# Patient Record
Sex: Female | Born: 1940 | Race: White | Hispanic: No | Smoking: Never smoker
Health system: Southern US, Community
[De-identification: ages and names within clinical notes are randomized; demographics above are authoritative.]

## PROBLEM LIST (undated history)

## (undated) DIAGNOSIS — F32A Depression, unspecified: Secondary | ICD-10-CM

## (undated) DIAGNOSIS — C55 Malignant neoplasm of uterus, part unspecified: Secondary | ICD-10-CM

## (undated) DIAGNOSIS — E785 Hyperlipidemia, unspecified: Secondary | ICD-10-CM

## (undated) DIAGNOSIS — E1142 Type 2 diabetes mellitus with diabetic polyneuropathy: Secondary | ICD-10-CM

## (undated) DIAGNOSIS — I10 Essential (primary) hypertension: Secondary | ICD-10-CM

## (undated) DIAGNOSIS — I639 Cerebral infarction, unspecified: Secondary | ICD-10-CM

## (undated) DIAGNOSIS — E119 Type 2 diabetes mellitus without complications: Secondary | ICD-10-CM

## (undated) DIAGNOSIS — F329 Major depressive disorder, single episode, unspecified: Secondary | ICD-10-CM

## (undated) DIAGNOSIS — K219 Gastro-esophageal reflux disease without esophagitis: Secondary | ICD-10-CM

## (undated) DIAGNOSIS — E669 Obesity, unspecified: Secondary | ICD-10-CM

## (undated) HISTORY — PX: ABDOMINAL HYSTERECTOMY: SHX81

## (undated) HISTORY — PX: UPPER GI ENDOSCOPY: SHX6162

## (undated) HISTORY — PX: COLONOSCOPY: SHX174

---

## 2007-03-30 ENCOUNTER — Ambulatory Visit: Payer: Self-pay | Admitting: Obstetrics and Gynecology

## 2007-09-21 ENCOUNTER — Ambulatory Visit: Payer: Self-pay | Admitting: Gastroenterology

## 2008-03-31 ENCOUNTER — Ambulatory Visit: Payer: Self-pay | Admitting: Obstetrics and Gynecology

## 2009-05-04 ENCOUNTER — Ambulatory Visit: Payer: Self-pay | Admitting: Obstetrics and Gynecology

## 2010-05-10 ENCOUNTER — Ambulatory Visit: Payer: Self-pay | Admitting: Obstetrics and Gynecology

## 2015-06-22 ENCOUNTER — Other Ambulatory Visit: Payer: Self-pay | Admitting: Internal Medicine

## 2015-06-22 DIAGNOSIS — Z1231 Encounter for screening mammogram for malignant neoplasm of breast: Secondary | ICD-10-CM

## 2015-06-28 ENCOUNTER — Ambulatory Visit
Admission: RE | Admit: 2015-06-28 | Discharge: 2015-06-28 | Disposition: A | Discharge status: Home / Self Care | Payer: Medicare Other | Source: Ambulatory Visit | Attending: Internal Medicine | Admitting: Internal Medicine

## 2015-06-28 DIAGNOSIS — Z1231 Encounter for screening mammogram for malignant neoplasm of breast: Secondary | ICD-10-CM | POA: Insufficient documentation

## 2015-06-28 DIAGNOSIS — R928 Other abnormal and inconclusive findings on diagnostic imaging of breast: Secondary | ICD-10-CM | POA: Diagnosis not present

## 2015-07-03 ENCOUNTER — Other Ambulatory Visit: Payer: Self-pay | Admitting: Internal Medicine

## 2015-07-03 DIAGNOSIS — R928 Other abnormal and inconclusive findings on diagnostic imaging of breast: Secondary | ICD-10-CM

## 2015-08-22 ENCOUNTER — Ambulatory Visit: Payer: Medicare Other

## 2015-08-22 ENCOUNTER — Other Ambulatory Visit: Payer: Medicare Other

## 2015-09-01 ENCOUNTER — Encounter: Payer: Self-pay | Admitting: *Deleted

## 2015-09-04 ENCOUNTER — Encounter
Admission: RE | Disposition: A | Discharge status: Home / Self Care | Payer: Self-pay | Source: Ambulatory Visit | Attending: Unknown Physician Specialty

## 2015-09-04 ENCOUNTER — Ambulatory Visit
Admission: RE | Admit: 2015-09-04 | Discharge: 2015-09-04 | Disposition: A | Discharge status: Home / Self Care | Payer: Medicare Other | Source: Ambulatory Visit | Attending: Unknown Physician Specialty | Admitting: Unknown Physician Specialty

## 2015-09-04 ENCOUNTER — Ambulatory Visit: Payer: Medicare Other | Admitting: Certified Registered Nurse Anesthetist

## 2015-09-04 ENCOUNTER — Encounter: Payer: Self-pay | Admitting: *Deleted

## 2015-09-04 DIAGNOSIS — Z7982 Long term (current) use of aspirin: Secondary | ICD-10-CM | POA: Diagnosis not present

## 2015-09-04 DIAGNOSIS — E669 Obesity, unspecified: Secondary | ICD-10-CM | POA: Diagnosis not present

## 2015-09-04 DIAGNOSIS — Z803 Family history of malignant neoplasm of breast: Secondary | ICD-10-CM | POA: Insufficient documentation

## 2015-09-04 DIAGNOSIS — Z794 Long term (current) use of insulin: Secondary | ICD-10-CM | POA: Insufficient documentation

## 2015-09-04 DIAGNOSIS — E1142 Type 2 diabetes mellitus with diabetic polyneuropathy: Secondary | ICD-10-CM | POA: Diagnosis not present

## 2015-09-04 DIAGNOSIS — K210 Gastro-esophageal reflux disease with esophagitis: Secondary | ICD-10-CM | POA: Diagnosis not present

## 2015-09-04 DIAGNOSIS — K640 First degree hemorrhoids: Secondary | ICD-10-CM | POA: Diagnosis not present

## 2015-09-04 DIAGNOSIS — Z6832 Body mass index (BMI) 32.0-32.9, adult: Secondary | ICD-10-CM | POA: Insufficient documentation

## 2015-09-04 DIAGNOSIS — K3189 Other diseases of stomach and duodenum: Secondary | ICD-10-CM | POA: Diagnosis not present

## 2015-09-04 DIAGNOSIS — D123 Benign neoplasm of transverse colon: Secondary | ICD-10-CM | POA: Diagnosis not present

## 2015-09-04 DIAGNOSIS — Z8542 Personal history of malignant neoplasm of other parts of uterus: Secondary | ICD-10-CM | POA: Diagnosis not present

## 2015-09-04 DIAGNOSIS — Z86010 Personal history of colonic polyps: Secondary | ICD-10-CM | POA: Diagnosis present

## 2015-09-04 DIAGNOSIS — F329 Major depressive disorder, single episode, unspecified: Secondary | ICD-10-CM | POA: Diagnosis not present

## 2015-09-04 DIAGNOSIS — K2970 Gastritis, unspecified, without bleeding: Secondary | ICD-10-CM | POA: Insufficient documentation

## 2015-09-04 DIAGNOSIS — I10 Essential (primary) hypertension: Secondary | ICD-10-CM | POA: Diagnosis not present

## 2015-09-04 DIAGNOSIS — Z79899 Other long term (current) drug therapy: Secondary | ICD-10-CM | POA: Insufficient documentation

## 2015-09-04 DIAGNOSIS — K5730 Diverticulosis of large intestine without perforation or abscess without bleeding: Secondary | ICD-10-CM | POA: Diagnosis not present

## 2015-09-04 HISTORY — DX: Hyperlipidemia, unspecified: E78.5

## 2015-09-04 HISTORY — DX: Major depressive disorder, single episode, unspecified: F32.9

## 2015-09-04 HISTORY — DX: Depression, unspecified: F32.A

## 2015-09-04 HISTORY — DX: Malignant neoplasm of uterus, part unspecified: C55

## 2015-09-04 HISTORY — DX: Essential (primary) hypertension: I10

## 2015-09-04 HISTORY — DX: Obesity, unspecified: E66.9

## 2015-09-04 HISTORY — DX: Type 2 diabetes mellitus without complications: E11.9

## 2015-09-04 HISTORY — DX: Type 2 diabetes mellitus with diabetic polyneuropathy: E11.42

## 2015-09-04 HISTORY — DX: Gastro-esophageal reflux disease without esophagitis: K21.9

## 2015-09-04 HISTORY — PX: ESOPHAGOGASTRODUODENOSCOPY (EGD) WITH PROPOFOL: SHX5813

## 2015-09-04 HISTORY — PX: COLONOSCOPY WITH PROPOFOL: SHX5780

## 2015-09-04 LAB — GLUCOSE, CAPILLARY
GLUCOSE-CAPILLARY: 342 mg/dL — AB (ref 65–99)
GLUCOSE-CAPILLARY: 320 mg/dL — AB (ref 65–99)

## 2015-09-04 SURGERY — COLONOSCOPY WITH PROPOFOL
Anesthesia: General

## 2015-09-04 MED ORDER — INSULIN REGULAR HUMAN 100 UNIT/ML IJ SOLN
5.0000 [IU] | Freq: Once | INTRAMUSCULAR | Status: AC
  Administered 2015-09-04: 5 [IU] via INTRAVENOUS
  Filled 2015-09-04: qty 0.05

## 2015-09-04 MED ORDER — LIDOCAINE HCL (PF) 2 % IJ SOLN
INTRAMUSCULAR | Status: DC | PRN
  Administered 2015-09-04: 50 mg

## 2015-09-04 MED ORDER — FENTANYL CITRATE (PF) 100 MCG/2ML IJ SOLN
INTRAMUSCULAR | Status: DC | PRN
  Administered 2015-09-04: 50 ug via INTRAVENOUS

## 2015-09-04 MED ORDER — ONDANSETRON HCL 4 MG/2ML IJ SOLN
INTRAMUSCULAR | Status: AC
  Administered 2015-09-04: 4 mg via INTRAVENOUS
  Filled 2015-09-04: qty 2

## 2015-09-04 MED ORDER — SODIUM CHLORIDE 0.9 % IV SOLN
INTRAVENOUS | Status: DC
  Administered 2015-09-04: 1000 mL via INTRAVENOUS

## 2015-09-04 MED ORDER — PROPOFOL 500 MG/50ML IV EMUL
INTRAVENOUS | Status: DC | PRN
  Administered 2015-09-04: 100 ug/kg/min via INTRAVENOUS

## 2015-09-04 MED ORDER — SODIUM CHLORIDE 0.9 % IV SOLN: INTRAVENOUS | Status: DC

## 2015-09-04 MED ORDER — MIDAZOLAM HCL 5 MG/5ML IJ SOLN
INTRAMUSCULAR | Status: DC | PRN
  Administered 2015-09-04: 1 mg via INTRAVENOUS

## 2015-09-04 MED ORDER — PROPOFOL 10 MG/ML IV BOLUS
INTRAVENOUS | Status: DC | PRN
  Administered 2015-09-04 (×2): 10 mg via INTRAVENOUS
  Administered 2015-09-04: 30 mg via INTRAVENOUS

## 2015-09-04 MED ORDER — INSULIN REGULAR HUMAN 100 UNIT/ML IJ SOLN
5.0000 [IU] | Freq: Once | INTRAMUSCULAR | Status: AC
  Administered 2015-09-04: 5 [IU] via INTRAVENOUS

## 2015-09-04 NOTE — Transfer of Care (Signed)
Immediate Anesthesia Transfer of Care Note  Patient: Kimberly Duarte  Procedure(s) Performed: Procedure(s): COLONOSCOPY WITH PROPOFOL (N/A) ESOPHAGOGASTRODUODENOSCOPY (EGD) WITH PROPOFOL (N/A)  Patient Location: PACU  Anesthesia Type:General  Level of Consciousness: sedated  Airway & Oxygen Therapy: Patient Spontanous Breathing and Patient connected to nasal cannula oxygen  Post-op Assessment: Report given to RN and Post -op Vital signs reviewed and stable  Post vital signs: Reviewed and stable  Last Vitals:  Filed Vitals:   09/04/15 0727  BP: 171/92  Pulse: 98  Temp: 37.1 C  Resp: 18    Complications: No apparent anesthesia complications

## 2015-09-04 NOTE — Anesthesia Preprocedure Evaluation (Signed)
Anesthesia Evaluation  Patient identified by MRN, date of birth, ID band Patient awake    Reviewed: Allergy & Precautions, H&P , NPO status , Patient's Chart, lab work & pertinent test results, reviewed documented beta blocker date and time   History of Anesthesia Complications (+) PONV and history of anesthetic complications  Airway Mallampati: II  TM Distance: >3 FB Neck ROM: full    Dental no notable dental hx. (+) Edentulous Upper, Edentulous Lower, Upper Dentures, Lower Dentures   Pulmonary neg pulmonary ROS,    Pulmonary exam normal breath sounds clear to auscultation       Cardiovascular Exercise Tolerance: Good hypertension, On Medications (-) angina(-) CAD, (-) Past MI, (-) Cardiac Stents and (-) CABG Normal cardiovascular exam(-) dysrhythmias (-) Valvular Problems/Murmurs Rhythm:regular Rate:Normal     Neuro/Psych neg Seizures PSYCHIATRIC DISORDERS (depression)  Neuromuscular disease (peripheral neuropathy)    GI/Hepatic Neg liver ROS, GERD  Medicated and Controlled,  Endo/Other  diabetes, Poorly Controlled, Insulin Dependent  Renal/GU negative Renal ROS  negative genitourinary   Musculoskeletal   Abdominal   Peds  Hematology negative hematology ROS (+)   Anesthesia Other Findings Past Medical History:   Depression                                                   Diabetes mellitus without complication (HCC)                 Diabetic peripheral neuropathy (HCC)                         GERD (gastroesophageal reflux disease)                       Hypertension                                                 Hyperlipidemia                                               Obesity                                                      Uterine cancer (HCC)                                         Reproductive/Obstetrics negative OB ROS                             Anesthesia  Physical Anesthesia Plan  ASA: III  Anesthesia Plan: General   Post-op Pain Management:    Induction:   Airway Management Planned:   Additional Equipment:   Intra-op Plan:   Post-operative Plan:   Informed Consent: I have reviewed the patients  History and Physical, chart, labs and discussed the procedure including the risks, benefits and alternatives for the proposed anesthesia with the patient or authorized representative who has indicated his/her understanding and acceptance.   Dental Advisory Given  Plan Discussed with: Anesthesiologist, CRNA and Surgeon  Anesthesia Plan Comments:         Anesthesia Quick Evaluation

## 2015-09-04 NOTE — Op Note (Signed)
[facility] Gastroenterology Patient Name: Kimberly Duarte Procedure Date: 09/04/2015 8:07 AM MRN: [id number] Account #: [id number] Date of Birth: 05-Jun-1941 Admit Type: Outpatient Age: 74 Room: [facility] ENDO ROOM 1 Gender: Female Note Status: Finalized Procedure:         Colonoscopy Indications:       High risk colon cancer surveillance: Personal history of                     colonic polyps Providers:         Manya Silvas, MD Medicines:         Propofol per Anesthesia Complications:     No immediate complications. Procedure:         Pre-Anesthesia Assessment:                    - After reviewing the risks and benefits, the patient was                     deemed in satisfactory condition to undergo the procedure.                    After obtaining informed consent, the colonoscope was                     passed under direct vision. Throughout the procedure, the                     patient's blood pressure, pulse, and oxygen saturations                     were monitored continuously. The Colonoscope was                     introduced through the anus and advanced to the the cecum,                     identified by appendiceal orifice and ileocecal valve. The                     colonoscopy was performed without difficulty. The patient                     tolerated the procedure well. The quality of the bowel                     preparation was good. Findings:      A diminutive polyp was found at the splenic flexure. The polyp was       sessile. The polyp was removed with a jumbo cold forceps. Resection and       retrieval were complete.      Multiple small-medium-mouthed diverticula were found in the sigmoid       colon, in the descending colon and in the transverse colon.      Internal hemorrhoids were found during endoscopy. The hemorrhoids were       small and Grade I (internal hemorrhoids that do not prolapse). Impression:        - One diminutive polyp at  the splenic flexure. Resected                     and retrieved.                    - Diverticulosis in the sigmoid colon, in  the descending                     colon and in the transverse colon.                    - Internal hemorrhoids. Recommendation:    - Await pathology results. Manya Silvas, MD 09/04/2015 8:46:21 AM This report has been signed electronically. Number of Addenda: 0 Note Initiated On: 09/04/2015 8:07 AM Scope Withdrawal Time: 0 hours 8 minutes 58 seconds  Total Procedure Duration: 0 hours 12 minutes 20 seconds       [facility]

## 2015-09-04 NOTE — Anesthesia Postprocedure Evaluation (Signed)
Anesthesia Post Note  Patient: KATURAH SULEK  Procedure(s) Performed: Procedure(s) (LRB): COLONOSCOPY WITH PROPOFOL (N/A) ESOPHAGOGASTRODUODENOSCOPY (EGD) WITH PROPOFOL (N/A)  Patient location during evaluation: PACU Anesthesia Type: General Level of consciousness: awake and alert Pain management: pain level controlled Vital Signs Assessment: post-procedure vital signs reviewed and stable Respiratory status: spontaneous breathing, nonlabored ventilation, respiratory function stable and patient connected to nasal cannula oxygen Cardiovascular status: blood pressure returned to baseline and stable Postop Assessment: no signs of nausea or vomiting Anesthetic complications: no    Last Vitals:  Filed Vitals:   09/04/15 0910 09/04/15 0920  BP: 143/64 149/68  Pulse: 100 92  Temp:    Resp: 18 21    Last Pain: There were no vitals filed for this visit.               Martha Clan

## 2015-09-04 NOTE — H&P (Signed)
Primary Care Physician:  Tracie Harrier, MD Primary Gastroenterologist:  Dr. Vira Agar  Pre-Procedure History & Physical: HPI:  Kimberly Duarte is a 74 y.o. female is here for an endoscopy and colonoscopy.She denies anything by mouth since midnight.   Past Medical History  Diagnosis Date  . Depression   . Diabetes mellitus without complication ([place])   . Diabetic peripheral neuropathy ([place])   . GERD (gastroesophageal reflux disease)   . Hypertension   . Hyperlipidemia   . Obesity   . Uterine cancer [facility])     Past Surgical History  Procedure Laterality Date  . Abdominal hysterectomy    . Colonoscopy    . Upper gi endoscopy      Prior to Admission medications   Medication Sig Start Date End Date Taking? Authorizing Provider  aspirin 81 MG tablet Take 81 mg by mouth daily.   Yes Historical Provider, MD  citalopram (CELEXA) 20 MG tablet Take 20 mg by mouth daily.   Yes Historical Provider, MD  glipiZIDE (GLUCOTROL) 10 MG tablet Take 10 mg by mouth daily before breakfast.   Yes Historical Provider, MD  hydrochlorothiazide (HYDRODIURIL) 25 MG tablet Take 25 mg by mouth daily.   Yes Historical Provider, MD  insulin aspart (NOVOLOG) 100 UNIT/ML injection Inject into the skin 3 (three) times daily before meals.   Yes Historical Provider, MD  lisinopril (PRINIVIL,ZESTRIL) 10 MG tablet Take 10 mg by mouth daily.   Yes Historical Provider, MD  metFORMIN (GLUCOPHAGE) 1000 MG tablet Take 1,000 mg by mouth 2 (two) times daily with a meal.   Yes Historical Provider, MD  omeprazole (PRILOSEC) 20 MG capsule Take 20 mg by mouth daily.   Yes Historical Provider, MD  pregabalin (LYRICA) 150 MG capsule Take 150 mg by mouth 2 (two) times daily.   Yes Historical Provider, MD  rOPINIRole (REQUIP) 1 MG tablet Take 1 mg by mouth 3 (three) times daily.   Yes Historical Provider, MD  Vitamin D, Ergocalciferol, (DRISDOL) 50000 UNITS CAPS capsule Take 50,000 Units by mouth every 7 (seven) days.   Yes  Historical Provider, MD    Allergies as of 08/01/2015  . (Not on File)    Family History  Problem Relation Age of Onset  . Breast cancer Sister 70    Social History   Social History  . Marital Status: Widowed    Spouse Name: N/A  . Number of Children: N/A  . Years of Education: N/A   Occupational History  . Not on file.   Social History Main Topics  . Smoking status: Never Smoker   . Smokeless tobacco: Not on file  . Alcohol Use: Not on file  . Drug Use: Not on file  . Sexual Activity: Not on file   Other Topics Concern  . Not on file   Social History Narrative    Review of Systems: See HPI, otherwise negative ROS  Physical Exam: BP 171/92 mmHg  Pulse 98  Temp(Src) 98.7 F (37.1 C) (Tympanic)  Resp 18  Ht 5\' 4"  (1.626 m)  Wt 86.183 kg (190 lb)  BMI 32.60 kg/m2  SpO2 96% General:   Alert,  pleasant and cooperative in NAD Head:  Normocephalic and atraumatic. Neck:  Supple; no masses or thyromegaly. Lungs:  Clear throughout to auscultation.    Heart:  Regular rate and rhythm. Abdomen:  Soft, nontender and nondistended. Normal bowel sounds, without guarding, and without rebound.   Neurologic:  Alert and  oriented x4;  grossly normal neurologically.  Impression/Plan: Kimberly Duarte is here for an endoscopy and colonoscopy to be performed for [facility] colon polyps and GERD  Risks, benefits, limitations, and alternatives regarding  endoscopy and colonoscopy have been reviewed with the patient.  Questions have been answered.  All parties agreeable.   Gaylyn Cheers, MD  09/04/2015, 8:03 AM

## 2015-09-04 NOTE — Op Note (Signed)
[facility] Gastroenterology Patient Name: Kimberly Duarte Procedure Date: 09/04/2015 8:07 AM MRN: [id number] Account #: [id number] Date of Birth: 23-Aug-1941 Admit Type: Outpatient Age: 74 Room: [facility] ENDO ROOM 1 Gender: Female Note Status: Finalized Procedure:         Upper GI endoscopy Indications:       Heartburn, Suspected gastro-esophageal reflux disease Providers:         Manya Silvas, MD Referring MD:      Tracie Harrier, MD (Referring MD) Medicines:         Propofol per Anesthesia Complications:     No immediate complications. Procedure:         Pre-Anesthesia Assessment:                    - After reviewing the risks and benefits, the patient was                     deemed in satisfactory condition to undergo the procedure.                    After obtaining informed consent, the endoscope was passed                     under direct vision. Throughout the procedure, the                     patient's blood pressure, pulse, and oxygen saturations                     were monitored continuously. The Endoscope was introduced                     through the mouth, and advanced to the second part of                     duodenum. The upper GI endoscopy was accomplished without                     difficulty. The patient tolerated the procedure well. Findings:      There were esophageal mucosal changes suspicious for short-segment       Barrett's esophagus present in the lower third of the esophagus.       Barretts started at 36cm and is about 2cm in length. The maximum       longitudinal extent of these mucosal changes was 2 cm in length. Mucosa       was biopsied with a cold forceps for histology. One specimen bottle was       sent to pathology.      Patchy mildly erythematous mucosa without bleeding was found in the       gastric antrum. Biopsies were taken with a cold forceps for histology.       Biopsies were taken with a cold forceps for Helicobacter  pylori testing.      The examined duodenum was normal. Impression:        - Esophageal mucosal changes suspicious for short-segment                     Barrett's esophagus. Biopsied.                    - Erythematous mucosa in the antrum. Biopsied. Recommendation:    - Await pathology results.                    -  The findings and recommendations were discussed with the                     patient's family. Manya Silvas, MD 09/04/2015 8:23:12 AM This report has been signed electronically. Number of Addenda: 0 Note Initiated On: 09/04/2015 8:07 AM      [facility]

## 2015-09-06 ENCOUNTER — Ambulatory Visit: Payer: Medicare Other

## 2015-09-06 ENCOUNTER — Other Ambulatory Visit: Payer: Medicare Other

## 2015-09-06 LAB — SURGICAL PATHOLOGY

## 2015-11-03 ENCOUNTER — Ambulatory Visit
Admission: RE | Admit: 2015-11-03 | Discharge: 2015-11-03 | Disposition: A | Discharge status: Home / Self Care | Payer: Medicare Other | Source: Ambulatory Visit | Attending: Internal Medicine | Admitting: Internal Medicine

## 2015-11-03 DIAGNOSIS — N6489 Other specified disorders of breast: Secondary | ICD-10-CM | POA: Insufficient documentation

## 2015-11-03 DIAGNOSIS — R928 Other abnormal and inconclusive findings on diagnostic imaging of breast: Secondary | ICD-10-CM | POA: Diagnosis present

## 2015-11-27 ENCOUNTER — Emergency Department
Admission: EM | Admit: 2015-11-27 | Discharge: 2015-11-27 | Disposition: A | Discharge status: Home / Self Care | Payer: Medicare Other | Attending: Emergency Medicine | Admitting: Emergency Medicine

## 2015-11-27 ENCOUNTER — Emergency Department: Payer: Medicare Other

## 2015-11-27 ENCOUNTER — Encounter: Payer: Self-pay | Admitting: Emergency Medicine

## 2015-11-27 DIAGNOSIS — M25512 Pain in left shoulder: Secondary | ICD-10-CM | POA: Diagnosis not present

## 2015-11-27 DIAGNOSIS — E1142 Type 2 diabetes mellitus with diabetic polyneuropathy: Secondary | ICD-10-CM | POA: Insufficient documentation

## 2015-11-27 DIAGNOSIS — R079 Chest pain, unspecified: Secondary | ICD-10-CM | POA: Insufficient documentation

## 2015-11-27 DIAGNOSIS — I10 Essential (primary) hypertension: Secondary | ICD-10-CM | POA: Insufficient documentation

## 2015-11-27 LAB — CBC
HCT: 41.7 % (ref 35.0–47.0)
Hemoglobin: 14 g/dL (ref 12.0–16.0)
MCH: 29.3 pg (ref 26.0–34.0)
MCHC: 33.6 g/dL (ref 32.0–36.0)
MCV: 87.3 fL (ref 80.0–100.0)
PLATELETS: 202 10*3/uL (ref 150–440)
RBC: 4.78 MIL/uL (ref 3.80–5.20)
RDW: 13.5 % (ref 11.5–14.5)
WBC: 7.6 10*3/uL (ref 3.6–11.0)

## 2015-11-27 LAB — BASIC METABOLIC PANEL
ANION GAP: 9 (ref 5–15)
BUN: 11 mg/dL (ref 6–20)
CALCIUM: 9.1 mg/dL (ref 8.9–10.3)
CO2: 24 mmol/L (ref 22–32)
Chloride: 107 mmol/L (ref 101–111)
Creatinine, Ser: 0.82 mg/dL (ref 0.44–1.00)
Glucose, Bld: 223 mg/dL — ABNORMAL HIGH (ref 65–99)
POTASSIUM: 3.7 mmol/L (ref 3.5–5.1)
Sodium: 140 mmol/L (ref 135–145)

## 2015-11-27 LAB — TROPONIN I

## 2015-11-27 NOTE — ED Provider Notes (Signed)
[facility] Emergency Department Provider Note     Time seen: ----------------------------------------- 4:02 PM on 11/27/2015 -----------------------------------------    I have reviewed the triage vital signs and the nursing notes.   HISTORY  Chief Complaint Chest Pain    HPI Kimberly Duarte is a 75 y.o. female who presents to the ER for chest pain that began approximately one week ago. Patient states the pain is in her left upper chest and left shoulder. Patient states left arm has been hurting her for some time. Patient went to go to clinic was brought here to be seen by the ER staff. She describes it as an annoying type pain, mostly radiating to left arm.   Past Medical History  Diagnosis Date  . Depression   . Diabetes mellitus without complication ([place])   . Diabetic peripheral neuropathy ([place])   . GERD (gastroesophageal reflux disease)   . Hypertension   . Hyperlipidemia   . Obesity   . Uterine cancer ([place])     There are no active problems to display for this patient.   Past Surgical History  Procedure Laterality Date  . Abdominal hysterectomy    . Colonoscopy    . Upper gi endoscopy    . Colonoscopy with propofol N/A 09/04/2015    Procedure: COLONOSCOPY WITH PROPOFOL;  Surgeon: Manya Silvas, MD;  Location: [facility] ENDOSCOPY;  Service: Endoscopy;  Laterality: N/A;  . Esophagogastroduodenoscopy (egd) with propofol N/A 09/04/2015    Procedure: ESOPHAGOGASTRODUODENOSCOPY (EGD) WITH PROPOFOL;  Surgeon: Manya Silvas, MD;  Location: [facility] ENDOSCOPY;  Service: Endoscopy;  Laterality: N/A;    Allergies Actos; Byetta 10 mcg pen; and Victoza  Social History Social History  Substance Use Topics  . Smoking status: Never Smoker   . Smokeless tobacco: None  . Alcohol Use: No    Review of Systems Constitutional: Negative for fever. Eyes: Negative for visual changes. ENT: Negative for sore throat. Cardiovascular: Positive for chest  pain Respiratory: Negative for shortness of breath. Gastrointestinal: Negative for abdominal pain, vomiting and diarrhea. Genitourinary: Negative for dysuria. Musculoskeletal: Negative for back pain. Skin: Negative for rash. Neurological: Negative for headaches, focal weakness or numbness.  10-point ROS otherwise negative.  ____________________________________________   PHYSICAL EXAM:  VITAL SIGNS: ED Triage Vitals  Enc Vitals Group     BP 11/27/15 1345 156/69 mmHg     Pulse Rate 11/27/15 1345 74     Resp 11/27/15 1345 18     Temp 11/27/15 1345 98.6 F (37 C)     Temp Source 11/27/15 1345 Oral     SpO2 11/27/15 1345 94 %     Weight 11/27/15 1345 189 lb (85.73 kg)     Height 11/27/15 1345 5\' 3"  (1.6 m)     Head Cir --      Peak Flow --      Pain Score 11/27/15 1343 3     Pain Loc --      Pain Edu? --      Excl. in [place]? --     Constitutional: Alert and oriented. Well appearing and in no distress. Eyes: Conjunctivae are normal. PERRL. Normal extraocular movements. ENT   Head: Normocephalic and atraumatic.   Nose: No congestion/rhinnorhea.   Mouth/Throat: Mucous membranes are moist.   Neck: No stridor. Cardiovascular: Normal rate, regular rhythm. Normal and symmetric distal pulses are present in all extremities. No murmurs, rubs, or gallops. Respiratory: Normal respiratory effort without tachypnea nor retractions. Breath sounds are clear and equal  bilaterally. No wheezes/rales/rhonchi. Gastrointestinal: Soft and nontender. No distention. No abdominal bruits.  Musculoskeletal: Nontender with normal range of motion in all extremities. No joint effusions.  No lower extremity tenderness nor edema. Left upper chest wall pain with range of motion of left shoulder. Neurologic:  Normal speech and language. No gross focal neurologic deficits are appreciated. Speech is normal. No gait instability. Skin:  Skin is warm, dry and intact. No rash noted. Psychiatric: Mood and  affect are normal. Speech and behavior are normal. Patient exhibits appropriate insight and judgment. ____________________________________________  EKG: Interpreted by me. Normal sinus rhythm with rate of 74 bpm, normal PR interval, normal QRS, normal QT interval. Normal axis. No evidence of acute infarction.  ____________________________________________  ED COURSE:  Pertinent labs & imaging results that were available during my care of the patient were reviewed by me and considered in my medical decision making (see chart for details). Patient is no acute distress, doubtful for ACS. I will check basic labs and reevaluate. ____________________________________________    LABS (pertinent positives/negatives)  Labs Reviewed  BASIC METABOLIC PANEL - Abnormal; Notable for the following:    Glucose, Bld 223 (*)    All other components within normal limits  CBC  TROPONIN I    RADIOLOGY  Chest x-ray is normal  ____________________________________________  FINAL ASSESSMENT AND PLAN  Chest pain  Plan: Patient with labs and imaging as dictated above. Pain is likely secondary to musculoskeletal origin. Patient states she sleeps on her left side. She does have some risk factors for ACS, but her EKG and troponin today are normal. She'll be referred to cardiology follow-up in 1-2 days for recheck.   Earleen Newport, MD   Earleen Newport, MD 11/27/15 [phone]

## 2015-11-27 NOTE — ED Notes (Signed)
Patient to ER for chest pain that began approx one week ago. States pain worsened today. Patient went to [facility], was brought over to ER by staff. Patient states "It's just kind of an annoying pain". Patient reports mild lightheadedness. +Radiating into back and left arm. Denies any similar feelings in the past.

## 2015-11-27 NOTE — Discharge Instructions (Signed)
Nonspecific Chest Pain  °Chest pain can be caused by many different conditions. There is always a chance that your pain could be related to something serious, such as a heart attack or a blood clot in your lungs. Chest pain can also be caused by conditions that are not life-threatening. If you have chest pain, it is very important to follow up with your health care provider. °CAUSES  °Chest pain can be caused by: °· Heartburn. °· Pneumonia or bronchitis. °· Anxiety or stress. °· Inflammation around your heart (pericarditis) or lung (pleuritis or pleurisy). °· A blood clot in your lung. °· A collapsed lung (pneumothorax). It can develop suddenly on its own (spontaneous pneumothorax) or from trauma to the chest. °· Shingles infection (varicella-zoster virus). °· Heart attack. °· Damage to the bones, muscles, and cartilage that make up your chest wall. This can include: °¨ Bruised bones due to injury. °¨ Strained muscles or cartilage due to frequent or repeated coughing or overwork. °¨ Fracture to one or more ribs. °¨ Sore cartilage due to inflammation (costochondritis). °RISK FACTORS  °Risk factors for chest pain may include: °· Activities that increase your risk for trauma or injury to your chest. °· Respiratory infections or conditions that cause frequent coughing. °· Medical conditions or overeating that can cause heartburn. °· Heart disease or family history of heart disease. °· Conditions or health behaviors that increase your risk of developing a blood clot. °· Having had chicken pox (varicella zoster). °SIGNS AND SYMPTOMS °Chest pain can feel like: °· Burning or tingling on the surface of your chest or deep in your chest. °· Crushing, pressure, aching, or squeezing pain. °· Dull or sharp pain that is worse when you move, cough, or take a deep breath. °· Pain that is also felt in your back, neck, shoulder, or arm, or pain that spreads to any of these areas. °Your chest pain may come and go, or it may stay  constant. °DIAGNOSIS °Lab tests or other studies may be needed to find the cause of your pain. Your health care provider may have you take a test called an ambulatory ECG (electrocardiogram). An ECG records your heartbeat patterns at the time the test is performed. You may also have other tests, such as: °· Transthoracic echocardiogram (TTE). During echocardiography, sound waves are used to create a picture of all of the heart structures and to look at how blood flows through your heart. °· Transesophageal echocardiogram (TEE). This is a more advanced imaging test that obtains images from inside your body. It allows your health care provider to see your heart in finer detail. °· Cardiac monitoring. This allows your health care provider to monitor your heart rate and rhythm in real time. °· Holter monitor. This is a portable device that records your heartbeat and can help to diagnose abnormal heartbeats. It allows your health care provider to track your heart activity for several days, if needed. °· Stress tests. These can be done through exercise or by taking medicine that makes your heart beat more quickly. °· Blood tests. °· Imaging tests. °TREATMENT  °Your treatment depends on what is causing your chest pain. Treatment may include: °· Medicines. These may include: °¨ Acid blockers for heartburn. °¨ Anti-inflammatory medicine. °¨ Pain medicine for inflammatory conditions. °¨ Antibiotic medicine, if an infection is present. °¨ Medicines to dissolve blood clots. °¨ Medicines to treat coronary artery disease. °· Supportive care for conditions that do not require medicines. This may include: °¨ Resting. °¨ Applying heat   or cold packs to injured areas.  Limiting activities until pain decreases. HOME CARE INSTRUCTIONS  If you were prescribed an antibiotic medicine, finish it all even if you start to feel better.  Avoid any activities that bring on chest pain.  Do not use any tobacco products, including  cigarettes, chewing tobacco, or electronic cigarettes. If you need help quitting, ask your health care provider.  Do not drink alcohol.  Take medicines only as directed by your health care provider.  Keep all follow-up visits as directed by your health care provider. This is important. This includes any further testing if your chest pain does not go away.  If heartburn is the cause for your chest pain, you may be told to keep your head raised (elevated) while sleeping. This reduces the chance that acid will go from your stomach into your esophagus.  Make lifestyle changes as directed by your health care provider. These may include:  Getting regular exercise. Ask your health care provider to suggest some activities that are safe for you.  Eating a heart-healthy diet. A registered dietitian can help you to learn healthy eating options.  Maintaining a healthy weight.  Managing diabetes, if necessary.  Reducing stress. SEEK MEDICAL CARE IF:  Your chest pain does not go away after treatment.  You have a rash with blisters on your chest.  You have a fever. SEEK IMMEDIATE MEDICAL CARE IF:   Your chest pain is worse.  You have an increasing cough, or you cough up blood.  You have severe abdominal pain.  You have severe weakness.  You faint.  You have chills.  You have sudden, unexplained chest discomfort.  You have sudden, unexplained discomfort in your arms, back, neck, or jaw.  You have shortness of breath at any time.  You suddenly start to sweat, or your skin gets clammy.  You feel nauseous or you vomit.  You suddenly feel light-headed or dizzy.  Your heart begins to beat quickly, or it feels like it is skipping beats. These symptoms may represent a serious problem that is an emergency. Do not wait to see if the symptoms will go away. Get medical help right away. Call your local emergency services (911 in the [place] Do not drive yourself to the hospital.   This  information is not intended to replace advice given to you by your health care provider. Make sure you discuss any questions you have with your health care provider.   Document Released: 06/26/2005 Document Revised: 10/07/2014 Document Reviewed: 04/22/2014 Elsevier Interactive Patient Education 2016 [employer]  Chest Wall Pain Chest wall pain is pain in or around the bones and muscles of your chest. Sometimes, an injury causes this pain. Sometimes, the cause may not be known. This pain may take several weeks or longer to get better. HOME CARE INSTRUCTIONS  Pay attention to any changes in your symptoms. Take these actions to help with your pain:   Rest as told by your health care provider.   Avoid activities that cause pain. These include any activities that use your chest muscles or your abdominal and side muscles to lift heavy items.   If directed, apply ice to the painful area:  Put ice in a plastic bag.  Place a towel between your skin and the bag.  Leave the ice on for 20 minutes, 2-3 times per day.  Take over-the-counter and prescription medicines only as told by your health care provider.  Do not use tobacco products, including cigarettes,  chewing tobacco, and e-cigarettes. If you need help quitting, ask your health care provider.  Keep all follow-up visits as told by your health care provider. This is important. SEEK MEDICAL CARE IF:  You have a fever.  Your chest pain becomes worse.  You have new symptoms. SEEK IMMEDIATE MEDICAL CARE IF:  You have nausea or vomiting.  You feel sweaty or light-headed.  You have a cough with phlegm (sputum) or you cough up blood.  You develop shortness of breath.   This information is not intended to replace advice given to you by your health care provider. Make sure you discuss any questions you have with your health care provider.   Document Released: 09/16/2005 Document Revised: 06/07/2015 Document Reviewed:  12/12/2014 Elsevier Interactive Patient Education [employer].

## 2015-11-27 NOTE — ED Notes (Signed)
CXR reviewed and WNL.

## 2015-11-27 NOTE — ED Notes (Signed)
Lab results and CXR results reviewed. Awaiting room placement.

## 2015-11-28 ENCOUNTER — Telehealth: Payer: Self-pay | Admitting: Cardiovascular Disease

## 2015-11-28 NOTE — Telephone Encounter (Signed)
Lmov to schedule ED fu from 11/27/15

## 2016-01-15 NOTE — Telephone Encounter (Signed)
Lmov to schedule ED fu from 11/27/15

## 2016-11-27 ENCOUNTER — Observation Stay: Payer: Medicare Other

## 2016-11-27 ENCOUNTER — Observation Stay
Admission: EM | Admit: 2016-11-27 | Discharge: 2016-11-28 | Disposition: A | Discharge status: Home / Self Care | Payer: Medicare Other | Attending: Internal Medicine | Admitting: Internal Medicine

## 2016-11-27 ENCOUNTER — Observation Stay
Admit: 2016-11-27 | Discharge: 2016-11-27 | Disposition: A | Discharge status: Home / Self Care | Payer: Medicare Other | Attending: Internal Medicine | Admitting: Internal Medicine

## 2016-11-27 ENCOUNTER — Emergency Department: Payer: Medicare Other

## 2016-11-27 ENCOUNTER — Encounter: Payer: Self-pay | Admitting: Emergency Medicine

## 2016-11-27 DIAGNOSIS — E669 Obesity, unspecified: Secondary | ICD-10-CM | POA: Insufficient documentation

## 2016-11-27 DIAGNOSIS — Z794 Long term (current) use of insulin: Secondary | ICD-10-CM | POA: Diagnosis not present

## 2016-11-27 DIAGNOSIS — E785 Hyperlipidemia, unspecified: Secondary | ICD-10-CM | POA: Insufficient documentation

## 2016-11-27 DIAGNOSIS — Z791 Long term (current) use of non-steroidal anti-inflammatories (NSAID): Secondary | ICD-10-CM | POA: Diagnosis not present

## 2016-11-27 DIAGNOSIS — N179 Acute kidney failure, unspecified: Secondary | ICD-10-CM | POA: Insufficient documentation

## 2016-11-27 DIAGNOSIS — I639 Cerebral infarction, unspecified: Secondary | ICD-10-CM

## 2016-11-27 DIAGNOSIS — K219 Gastro-esophageal reflux disease without esophagitis: Secondary | ICD-10-CM | POA: Insufficient documentation

## 2016-11-27 DIAGNOSIS — G458 Other transient cerebral ischemic attacks and related syndromes: Secondary | ICD-10-CM

## 2016-11-27 DIAGNOSIS — Z7982 Long term (current) use of aspirin: Secondary | ICD-10-CM | POA: Diagnosis not present

## 2016-11-27 DIAGNOSIS — F329 Major depressive disorder, single episode, unspecified: Secondary | ICD-10-CM | POA: Insufficient documentation

## 2016-11-27 DIAGNOSIS — Z79899 Other long term (current) drug therapy: Secondary | ICD-10-CM | POA: Diagnosis not present

## 2016-11-27 DIAGNOSIS — I10 Essential (primary) hypertension: Secondary | ICD-10-CM | POA: Diagnosis not present

## 2016-11-27 DIAGNOSIS — E1142 Type 2 diabetes mellitus with diabetic polyneuropathy: Secondary | ICD-10-CM | POA: Diagnosis not present

## 2016-11-27 DIAGNOSIS — Z8542 Personal history of malignant neoplasm of other parts of uterus: Secondary | ICD-10-CM | POA: Diagnosis not present

## 2016-11-27 LAB — GLUCOSE, CAPILLARY
GLUCOSE-CAPILLARY: 254 mg/dL — AB (ref 65–99)
Glucose-Capillary: 229 mg/dL — ABNORMAL HIGH (ref 65–99)
Glucose-Capillary: 222 mg/dL — ABNORMAL HIGH (ref 65–99)
Glucose-Capillary: 264 mg/dL — ABNORMAL HIGH (ref 65–99)
Glucose-Capillary: 210 mg/dL — ABNORMAL HIGH (ref 65–99)

## 2016-11-27 LAB — CBC
HEMATOCRIT: 40.7 % (ref 35.0–47.0)
HEMOGLOBIN: 14.4 g/dL (ref 12.0–16.0)
MCH: 30.2 pg (ref 26.0–34.0)
MCHC: 35.4 g/dL (ref 32.0–36.0)
MCV: 85.4 fL (ref 80.0–100.0)
Platelets: 229 10*3/uL (ref 150–440)
RBC: 4.76 MIL/uL (ref 3.80–5.20)
RDW: 13.6 % (ref 11.5–14.5)
WBC: 10 10*3/uL (ref 3.6–11.0)

## 2016-11-27 LAB — SEDIMENTATION RATE: Sed Rate: 17 mm/hr (ref 0–30)

## 2016-11-27 LAB — BASIC METABOLIC PANEL
Anion gap: 9 (ref 5–15)
BUN: 23 mg/dL — ABNORMAL HIGH (ref 6–20)
CALCIUM: 9.3 mg/dL (ref 8.9–10.3)
CHLORIDE: 103 mmol/L (ref 101–111)
CO2: 27 mmol/L (ref 22–32)
CREATININE: 1.29 mg/dL — AB (ref 0.44–1.00)
GFR calc Af Amer: 46 mL/min — ABNORMAL LOW (ref >60)
GFR calc non Af Amer: 40 mL/min — ABNORMAL LOW (ref >60)
GLUCOSE: 356 mg/dL — AB (ref 65–99)
Potassium: 4.1 mmol/L (ref 3.5–5.1)
Sodium: 139 mmol/L (ref 135–145)

## 2016-11-27 LAB — TSH: TSH: 1.966 u[IU]/mL (ref 0.350–4.500)

## 2016-11-27 LAB — TROPONIN I: Troponin I: <0.03 ng/mL (ref <0.03)

## 2016-11-27 MED ORDER — PERFLUTREN LIPID MICROSPHERE
1.0000 mL | INTRAVENOUS | Status: AC | PRN
  Administered 2016-11-27: 2 mL via INTRAVENOUS
  Filled 2016-11-27: qty 10

## 2016-11-27 MED ORDER — INSULIN ASPART 100 UNIT/ML ~~LOC~~ SOLN
0.0000 [IU] | Freq: Three times a day (TID) | SUBCUTANEOUS | Status: DC
  Administered 2016-11-27 – 2016-11-28 (×4): 5 [IU] via SUBCUTANEOUS
  Filled 2016-11-27 (×4): qty 5

## 2016-11-27 MED ORDER — HYDROCHLOROTHIAZIDE 12.5 MG PO CAPS: 12.5000 mg | ORAL_CAPSULE | Freq: Every day | ORAL | Status: DC

## 2016-11-27 MED ORDER — CITALOPRAM HYDROBROMIDE 20 MG PO TABS
20.0000 mg | ORAL_TABLET | Freq: Every day | ORAL | Status: DC
  Administered 2016-11-27 – 2016-11-28 (×2): 20 mg via ORAL
  Filled 2016-11-27 (×2): qty 1

## 2016-11-27 MED ORDER — METFORMIN HCL 500 MG PO TABS
1000.0000 mg | ORAL_TABLET | Freq: Two times a day (BID) | ORAL | Status: DC
  Administered 2016-11-27 – 2016-11-28 (×2): 1000 mg via ORAL
  Filled 2016-11-27 (×2): qty 2

## 2016-11-27 MED ORDER — SODIUM CHLORIDE 0.9 % IV SOLN
INTRAVENOUS | Status: DC
  Administered 2016-11-27: 12:00:00 via INTRAVENOUS

## 2016-11-27 MED ORDER — ATORVASTATIN CALCIUM 40 MG PO TABS: 40.0000 mg | ORAL_TABLET | Freq: Every day | ORAL | 0 refills | Status: DC

## 2016-11-27 MED ORDER — AMITRIPTYLINE HCL 10 MG PO TABS
25.0000 mg | ORAL_TABLET | Freq: Every evening | ORAL | Status: DC | PRN
  Filled 2016-11-27: qty 2.5

## 2016-11-27 MED ORDER — LISINOPRIL 20 MG PO TABS
20.0000 mg | ORAL_TABLET | Freq: Every day | ORAL | Status: DC
Start: 2016-11-27 — End: 2016-11-28
  Administered 2016-11-27 – 2016-11-28 (×2): 20 mg via ORAL
  Filled 2016-11-27 (×2): qty 1

## 2016-11-27 MED ORDER — ONDANSETRON HCL 4 MG PO TABS: 4.0000 mg | ORAL_TABLET | Freq: Four times a day (QID) | ORAL | Status: DC | PRN

## 2016-11-27 MED ORDER — LISINOPRIL 20 MG PO TABS: 20.0000 mg | ORAL_TABLET | Freq: Every day | ORAL | Status: DC

## 2016-11-27 MED ORDER — ASPIRIN EC 325 MG PO TBEC: 325.0000 mg | DELAYED_RELEASE_TABLET | Freq: Every day | ORAL | 0 refills | Status: DC

## 2016-11-27 MED ORDER — ACETAMINOPHEN 325 MG PO TABS: 650.0000 mg | ORAL_TABLET | Freq: Four times a day (QID) | ORAL | Status: DC | PRN

## 2016-11-27 MED ORDER — ONDANSETRON HCL 4 MG/2ML IJ SOLN: 4.0000 mg | Freq: Four times a day (QID) | INTRAMUSCULAR | Status: DC | PRN

## 2016-11-27 MED ORDER — STROKE: EARLY STAGES OF RECOVERY BOOK
Freq: Once | Status: AC
  Administered 2016-11-27: 12:00:00

## 2016-11-27 MED ORDER — ASPIRIN 81 MG PO CHEW
324.0000 mg | CHEWABLE_TABLET | Freq: Once | ORAL | Status: AC
  Administered 2016-11-27: 324 mg via ORAL
  Filled 2016-11-27: qty 4

## 2016-11-27 MED ORDER — PANTOPRAZOLE SODIUM 40 MG PO TBEC
40.0000 mg | DELAYED_RELEASE_TABLET | Freq: Every day | ORAL | Status: DC
  Administered 2016-11-27 – 2016-11-28 (×2): 40 mg via ORAL
  Filled 2016-11-27 (×2): qty 1

## 2016-11-27 MED ORDER — DOCUSATE SODIUM 100 MG PO CAPS
100.0000 mg | ORAL_CAPSULE | Freq: Two times a day (BID) | ORAL | Status: DC
  Administered 2016-11-27: 100 mg via ORAL
  Filled 2016-11-27 (×2): qty 1

## 2016-11-27 MED ORDER — SODIUM CHLORIDE 0.9% FLUSH
3.0000 mL | Freq: Two times a day (BID) | INTRAVENOUS | Status: DC
  Administered 2016-11-27 – 2016-11-28 (×3): 3 mL via INTRAVENOUS

## 2016-11-27 MED ORDER — INSULIN GLARGINE 100 UNIT/ML ~~LOC~~ SOLN
45.0000 [IU] | Freq: Every day | SUBCUTANEOUS | Status: DC
  Administered 2016-11-27: 45 [IU] via SUBCUTANEOUS
  Filled 2016-11-27 (×2): qty 0.45

## 2016-11-27 MED ORDER — SODIUM CHLORIDE 0.9 % IV BOLUS (SEPSIS)
1000.0000 mL | Freq: Once | INTRAVENOUS | Status: AC
  Administered 2016-11-27: 1000 mL via INTRAVENOUS

## 2016-11-27 MED ORDER — ROPINIROLE HCL 1 MG PO TABS
0.5000 mg | ORAL_TABLET | Freq: Every evening | ORAL | Status: DC
  Administered 2016-11-27: 18:00:00 0.5 mg via ORAL
  Filled 2016-11-27: qty 1

## 2016-11-27 MED ORDER — ACETAMINOPHEN 650 MG RE SUPP: 650.0000 mg | Freq: Four times a day (QID) | RECTAL | Status: DC | PRN

## 2016-11-27 MED ORDER — PREGABALIN 75 MG PO CAPS
225.0000 mg | ORAL_CAPSULE | Freq: Two times a day (BID) | ORAL | Status: DC
  Administered 2016-11-27 – 2016-11-28 (×3): 225 mg via ORAL
  Filled 2016-11-27 (×3): qty 3

## 2016-11-27 MED ORDER — VITAMIN D (ERGOCALCIFEROL) 1.25 MG (50000 UNIT) PO CAPS: 50000.0000 [IU] | ORAL_CAPSULE | ORAL | Status: DC

## 2016-11-27 MED ORDER — ASPIRIN EC 81 MG PO TBEC
81.0000 mg | DELAYED_RELEASE_TABLET | Freq: Every day | ORAL | Status: DC
  Administered 2016-11-27 – 2016-11-28 (×2): 81 mg via ORAL
  Filled 2016-11-27 (×2): qty 1

## 2016-11-27 MED ORDER — HYDROCHLOROTHIAZIDE 12.5 MG PO CAPS
12.5000 mg | ORAL_CAPSULE | Freq: Every day | ORAL | Status: DC
  Administered 2016-11-27 – 2016-11-28 (×2): 12.5 mg via ORAL
  Filled 2016-11-27 (×2): qty 1

## 2016-11-27 MED ORDER — HEPARIN SODIUM (PORCINE) 5000 UNIT/ML IJ SOLN
5000.0000 [IU] | Freq: Three times a day (TID) | INTRAMUSCULAR | Status: DC
  Administered 2016-11-27 – 2016-11-28 (×3): 5000 [IU] via SUBCUTANEOUS
  Filled 2016-11-27 (×3): qty 1

## 2016-11-27 MED ORDER — LISINOPRIL 10 MG PO TABS
10.0000 mg | ORAL_TABLET | Freq: Once | ORAL | Status: AC
  Administered 2016-11-27: 10 mg via ORAL
  Filled 2016-11-27: qty 1

## 2016-11-27 NOTE — Consult Note (Signed)
Referring Physician: Sudini    Chief Complaint: Left facial numbness  HPI: Kimberly Duarte is an 76 y.o. female with a history of DM who reports that on yesterday while watching television she had the acute onset of numbness around her left eye.  The numbness wold progress to invovle her left cheek.  Symptoms lasted only a few minutes then resolved spontaneously.  She had 3 episodes yesterday and after it recurred again overnight she presented.  Reports that her symptoms have continued to be intermittent with her currently having some left facial tingling that is mild.  Initial NIHSS of 1.  Date last known well: Date: 11/27/2016 Time last known well: Time: 11:00 tPA Given: No: Mild symptoms with spontaneous resolution  Past Medical History:  Diagnosis Date  . Depression   . Diabetes mellitus without complication ([place])   . Diabetic peripheral neuropathy ([place])   . GERD (gastroesophageal reflux disease)   . Hyperlipidemia   . Hypertension   . Obesity   . Uterine cancer [facility])     Past Surgical History:  Procedure Laterality Date  . ABDOMINAL HYSTERECTOMY    . COLONOSCOPY    . COLONOSCOPY WITH PROPOFOL N/A 09/04/2015   Procedure: COLONOSCOPY WITH PROPOFOL;  Surgeon: Manya Silvas, MD;  Location: [facility] ENDOSCOPY;  Service: Endoscopy;  Laterality: N/A;  . ESOPHAGOGASTRODUODENOSCOPY (EGD) WITH PROPOFOL N/A 09/04/2015   Procedure: ESOPHAGOGASTRODUODENOSCOPY (EGD) WITH PROPOFOL;  Surgeon: Manya Silvas, MD;  Location: [facility] ENDOSCOPY;  Service: Endoscopy;  Laterality: N/A;  . UPPER GI ENDOSCOPY      Family History  Problem Relation Age of Onset  . Breast cancer Sister 10  . Colon cancer Mother   . Heart disease Father    Social History:  reports that she has never smoked. She has never used smokeless tobacco. She reports that she does not drink alcohol. Her drug history is not on file.  Allergies:  Allergies  Allergen Reactions  . Actos [Pioglitazone] Other (See Comments)     Reaction:  Stomach cramps   . Byetta 10 Mcg Pen [Exenatide] Nausea And Vomiting  . Victoza [Liraglutide] Other (See Comments)    Reaction:  Burning and stinging of feet      Medications:  I have reviewed the patient's current medications. Prior to Admission:  Prescriptions Prior to Admission  Medication Sig Dispense Refill Last Dose  . amitriptyline (ELAVIL) 25 MG tablet Take 25 mg by mouth at bedtime as needed for sleep.   11/26/2016 at Unknown time  . aspirin EC 81 MG tablet Take 81 mg by mouth daily.   11/26/2016 at Unknown time  . citalopram (CELEXA) 20 MG tablet Take 20 mg by mouth daily.     . Exenatide ER 2 MG/0.85ML AUIJ Inject 2 mg into the skin once a week.   Past Week at Unknown time  . glipiZIDE (GLUCOTROL) 10 MG tablet Take 10 mg by mouth 2 (two) times daily.    11/26/2016 at Unknown time  . hydrochlorothiazide (MICROZIDE) 12.5 MG capsule Take 12.5 mg by mouth daily.   11/26/2016 at Unknown time  . insulin lispro (HUMALOG) 100 UNIT/ML injection Inject 5-20 Units into the skin as needed for high blood sugar. If sugar 175 - 225 -- take 5 units If sugar 226 - 275 -- take 10 units If sugar 276 - 325 -- take 15 units If sugar over 326 -- take 20 units     . insulin NPH Human (HUMULIN N,NOVOLIN N) 100 UNIT/ML injection Inject 30  Units into the skin 2 (two) times daily. 12 hours apart   11/26/2016 at Unknown time  . lisinopril (PRINIVIL,ZESTRIL) 20 MG tablet Take 20 mg by mouth daily.   11/26/2016 at Unknown time  . meloxicam (MOBIC) 15 MG tablet Take 15 mg by mouth daily.   11/26/2016 at Unknown time  . metFORMIN (GLUCOPHAGE) 1000 MG tablet Take 1,000 mg by mouth 2 (two) times daily with a meal.   11/26/2016 at Unknown time  . omeprazole (PRILOSEC) 20 MG capsule Take 20 mg by mouth 2 (two) times daily.    11/26/2016 at Unknown time  . pregabalin (LYRICA) 225 MG capsule Take 225 mg by mouth 2 (two) times daily.   11/26/2016 at Unknown time  . rOPINIRole (REQUIP) 0.5 MG tablet Take 0.5 mg by  mouth every evening.   11/26/2016 at Unknown time  . Vitamin D, Ergocalciferol, (DRISDOL) 50000 UNITS CAPS capsule Take 50,000 Units by mouth every 7 (seven) days.   Past Week at Unknown time   Scheduled: .  stroke: mapping our early stages of recovery book   Does not apply Once  . aspirin EC  81 mg Oral Daily  . citalopram  20 mg Oral Daily  . docusate sodium  100 mg Oral BID  . heparin  5,000 Units Subcutaneous Q8H  . hydrochlorothiazide  12.5 mg Oral Daily  . insulin aspart  0-15 Units Subcutaneous TID WC  . insulin glargine  45 Units Subcutaneous QHS  . pantoprazole  40 mg Oral Daily  . pregabalin  225 mg Oral BID  . rOPINIRole  0.5 mg Oral QPM  . sodium chloride flush  3 mL Intravenous Q12H  . [START ON 12/02/2016] Vitamin D (Ergocalciferol)  50,000 Units Oral Q Mon    ROS: History obtained from the patient  General ROS: negative for - chills, fatigue, fever, night sweats, weight gain or weight loss Psychological ROS: negative for - behavioral disorder, hallucinations, memory difficulties, mood swings or suicidal ideation Ophthalmic ROS: negative for - blurry vision, double vision, eye pain or loss of vision ENT ROS: negative for - epistaxis, nasal discharge, oral lesions, sore throat, tinnitus or vertigo Allergy and Immunology ROS: negative for - hives or itchy/watery eyes Hematological and Lymphatic ROS: negative for - bleeding problems, bruising or swollen lymph nodes Endocrine ROS: negative for - galactorrhea, hair pattern changes, polydipsia/polyuria or temperature intolerance Respiratory ROS: negative for - cough, hemoptysis, shortness of breath or wheezing Cardiovascular ROS: negative for - chest pain, dyspnea on exertion, edema or irregular heartbeat Gastrointestinal ROS: negative for - abdominal pain, diarrhea, hematemesis, nausea/vomiting or stool incontinence Genito-Urinary ROS: negative for - dysuria, hematuria, incontinence or urinary frequency/urgency Musculoskeletal  ROS: foot pain Neurological ROS: as noted in HPI Dermatological ROS: negative for rash and skin lesion changes  Physical Examination: Blood pressure (!) 176/76, pulse 75, temperature 98.1 F (36.7 C), temperature source Oral, resp. rate (!) 24, height 5' 3"  (1.6 m), weight 91.7 kg (202 lb 3 oz), SpO2 96 %.  HEENT-  Normocephalic, no lesions, without obvious abnormality.  Normal external eye and conjunctiva.  Normal TM's bilaterally.  Normal auditory canals and external ears. Normal external nose, mucus membranes and septum.  Normal pharynx. Cardiovascular- S1, S2 normal, pulses palpable throughout   Lungs- chest clear, no wheezing, rales, normal symmetric air entry Abdomen- soft, non-tender; bowel sounds normal; no masses,  no organomegaly Extremities- no edema Lymph-no adenopathy palpable Musculoskeletal-no joint tenderness, deformity or swelling Skin-warm and dry, no hyperpigmentation, vitiligo, or suspicious  lesions  Neurological Examination   Mental Status: Alert, oriented, thought content appropriate.  Speech fluent without evidence of aphasia.  Able to follow 3 step commands without difficulty. Cranial Nerves: II: Discs flat bilaterally; Visual fields grossly normal, pupils equal, round, reactive to light and accommodation III,IV, VI: ptosis not present, extra-ocular motions intact bilaterally V,VII: smile symmetric, facial light touch sensation decreased on the left VIII: hearing normal bilaterally IX,X: gag reflex present XI: bilateral shoulder shrug XII: midline tongue extension Motor: Right : Upper extremity   5/5    Left:     Upper extremity   5/5  Lower extremity   5/5     Lower extremity   5/5 Tone and bulk:normal tone throughout; no atrophy noted Sensory: Pinprick and light touch intact throughout, bilaterally Deep Tendon Reflexes: 2+ and symmetric with absent AJ's bilaterally Plantars: Right: downgoing   Left: downgoing Cerebellar: Normal finger-to-nose and normal  heel-to-shin testing bilaterally Gait: normal gait and station    Laboratory Studies:  Basic Metabolic Panel:  Recent Labs Lab 11/27/16 0128  NA 139  K 4.1  CL 103  CO2 27  GLUCOSE 356*  BUN 23*  CREATININE 1.29*  CALCIUM 9.3    Liver Function Tests: No results for input(s): AST, ALT, ALKPHOS, BILITOT, PROT, ALBUMIN in the last 168 hours. No results for input(s): LIPASE, AMYLASE in the last 168 hours. No results for input(s): AMMONIA in the last 168 hours.  CBC:  Recent Labs Lab 11/27/16 0128  WBC 10.0  HGB 14.4  HCT 40.7  MCV 85.4  PLT 229    Cardiac Enzymes:  Recent Labs Lab 11/27/16 0128  TROPONINI <0.03    BNP: Invalid input(s): POCBNP  CBG:  Recent Labs Lab 11/27/16 0538 11/27/16 0905  GLUCAP 254* 229*    Microbiology: No results found for this or any previous visit.  Coagulation Studies: No results for input(s): LABPROT, INR in the last 72 hours.  Urinalysis: No results for input(s): COLORURINE, LABSPEC, PHURINE, GLUCOSEU, HGBUR, BILIRUBINUR, KETONESUR, PROTEINUR, UROBILINOGEN, NITRITE, LEUKOCYTESUR in the last 168 hours.  Invalid input(s): APPERANCEUR  Lipid Panel: No results found for: CHOL, TRIG, HDL, CHOLHDL, VLDL, LDLCALC  HgbA1C: No results found for: HGBA1C  Urine Drug Screen:  No results found for: LABOPIA, COCAINSCRNUR, LABBENZ, AMPHETMU, THCU, LABBARB  Alcohol Level: No results for input(s): ETH in the last 168 hours.  Other results: EKG: normal sinus rhythm at 68 bpm.  Imaging: Ct Head Wo Contrast  Result Date: 11/27/2016 CLINICAL DATA:  Acute onset of left-sided facial numbness. Initial encounter. EXAM: CT HEAD WITHOUT CONTRAST TECHNIQUE: Contiguous axial images were obtained from the base of the skull through the vertex without intravenous contrast. COMPARISON:  None. FINDINGS: Brain: No evidence of acute infarction, hemorrhage, hydrocephalus, extra-axial collection or mass lesion/mass effect. Prominence of the  ventricles and sulci reflects mild cortical volume loss. A cavum vergae is noted. The brainstem and fourth ventricle are within normal limits. The basal ganglia are unremarkable in appearance. The cerebral hemispheres demonstrate grossly normal gray-white differentiation. No mass effect or midline shift is seen. Vascular: No hyperdense vessel or unexpected calcification. Skull: There is no evidence of fracture; visualized osseous structures are unremarkable in appearance. Sinuses/Orbits: The visualized portions of the orbits are within normal limits. The paranasal sinuses and mastoid air cells are well-aerated. Other: No significant soft tissue abnormalities are seen. IMPRESSION: 1. No acute intracranial pathology seen on CT. 2. Mild cortical volume loss noted. Electronically Signed   By: Francoise Schaumann.D.  On: 11/27/2016 02:31   Mr Brain Wo Contrast  Result Date: 11/27/2016 CLINICAL DATA:  Intermittent LEFT facial numbness since noon. History of hypertension, hyperlipidemia and diabetes. EXAM: MRI HEAD WITHOUT CONTRAST TECHNIQUE: Multiplanar, multiecho pulse sequences of the brain and surrounding structures were obtained without intravenous contrast. COMPARISON:  CT HEAD November 27, 2016 at 0202 hours FINDINGS: BRAIN: No reduced diffusion to suggest acute ischemia. No susceptibility artifact to suggest hemorrhage. The ventricles and sulci are normal for patient's age, cavum velum interpositum. Small area RIGHT frontal lobe encephalomalacia. A few scattered subcentimeter supratentorial white matter FLAIR T2 hyperintensities compatible with mild chronic small vessel ischemic disease, less than expected for age. No suspicious parenchymal signal, masses or mass effect. No abnormal extra-axial fluid collections. VASCULAR: Normal major intracranial vascular flow voids present at skull base. SKULL AND UPPER CERVICAL SPINE: No abnormal sellar expansion. No suspicious calvarial bone marrow signal. Craniocervical  junction maintained. SINUSES/ORBITS: The mastoid air-cells and included paranasal sinuses are well-aerated. The included ocular globes and orbital contents are non-suspicious. OTHER: Patient is edentulous. IMPRESSION: No acute intracranial process. 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 Electronically Signed   By: Elon Alas M.D.   On: 11/27/2016 03:59    Assessment: 76 y.o. female presenting with episodic facial numbness.  MRI of the brain reviewed and shows no acute changes.  If this is an ischemic presentation, it would be very small vessel.  Patient on ASA at home.  Suspect other possible etiologies.  TSH normal.  Last A1c 8.4 on 1/29.  Further work up recommended.    Stroke Risk Factors - diabetes mellitus, hyperlipidemia and hypertension  Plan: 1. HgbA1c, fasting lipid panel, ESR 2. Frequent neuro checks 3. PT consult, OT consult, Speech consult 4. Echocardiogram 5. Carotid dopplers 6. Prophylactic therapy-Increase ASA to 325m daily 7. NPO until RN stroke swallow screen 8. Telemetry monitoring 9. Blood sugar mangement   LAlexis Goodell MD Neurology [phone]/28/2018, 11:27 AM

## 2016-11-27 NOTE — Progress Notes (Signed)
SLP Cancellation Note  Patient Details Name: MANASA LAROCHE MRN: [id number] DOB: December 28, 1940   Cancelled treatment:       Reason Eval/Treat Not Completed: SLP screened, no needs identified, will sign off (chart reviewed; NSG consulted. Pt indicated no needs.). NSG to reconsult if any change in status.    Orinda Kenner, MS, CCC-SLP [place]eh Milroy 11/27/2016, 1:03 PM

## 2016-11-27 NOTE — Progress Notes (Signed)
OT Cancellation Note  Patient Details Name: Kimberly Duarte MRN: [id number] DOB: 20-Jun-1941   Cancelled Treatment:    Reason Eval/Treat Not Completed: OT screened, no needs identified, will sign off  Harrel Carina, MS, OTR/L 11/27/2016, 12:13 PM

## 2016-11-27 NOTE — ED Provider Notes (Signed)
[facility] Emergency Department Provider Note    First MD Initiated Contact with Patient 11/27/16 [phone]     (approximate)  I have reviewed the triage vital signs and the nursing notes.   HISTORY  Chief Complaint Numbness    HPI Kimberly Duarte is a 76 y.o. female with below list of chronic medical conditions presents to the emergency department with left-sided facial numbness since noon yesterday (11/26/2016). Patient denies any other symptoms no headache no nausea or vomiting. Patient denies any visual changes no gait instability. Patient denies any weakness.   Past Medical History:  Diagnosis Date  . Depression   . Diabetes mellitus without complication ([place])   . Diabetic peripheral neuropathy ([place])   . GERD (gastroesophageal reflux disease)   . Hyperlipidemia   . Hypertension   . Obesity   . Uterine cancer ([place])     There are no active problems to display for this patient.   Past Surgical History:  Procedure Laterality Date  . ABDOMINAL HYSTERECTOMY    . COLONOSCOPY    . COLONOSCOPY WITH PROPOFOL N/A 09/04/2015   Procedure: COLONOSCOPY WITH PROPOFOL;  Surgeon: Manya Silvas, MD;  Location: [facility] ENDOSCOPY;  Service: Endoscopy;  Laterality: N/A;  . ESOPHAGOGASTRODUODENOSCOPY (EGD) WITH PROPOFOL N/A 09/04/2015   Procedure: ESOPHAGOGASTRODUODENOSCOPY (EGD) WITH PROPOFOL;  Surgeon: Manya Silvas, MD;  Location: [facility] ENDOSCOPY;  Service: Endoscopy;  Laterality: N/A;  . UPPER GI ENDOSCOPY      Prior to Admission medications   Medication Sig Start Date End Date Taking? Authorizing Provider  amitriptyline (ELAVIL) 25 MG tablet Take 25 mg by mouth at bedtime as needed for sleep.   Yes Historical Provider, MD  aspirin EC 81 MG tablet Take 81 mg by mouth daily.   Yes Historical Provider, MD  Exenatide ER 2 MG/0.85ML AUIJ Inject 2 mg into the skin once a week.   Yes Historical Provider, MD  glipiZIDE (GLUCOTROL) 10 MG tablet Take 10 mg by mouth  2 (two) times daily.    Yes Historical Provider, MD  hydrochlorothiazide (MICROZIDE) 12.5 MG capsule Take 12.5 mg by mouth daily.   Yes Historical Provider, MD  insulin lispro (HUMALOG) 100 UNIT/ML injection Inject 5-20 Units into the skin as needed for high blood sugar. If sugar 175 - 225 -- take 5 units If sugar 226 - 275 -- take 10 units If sugar 276 - 325 -- take 15 units If sugar over 326 -- take 20 units   Yes Historical Provider, MD  insulin NPH Human (HUMULIN N,NOVOLIN N) 100 UNIT/ML injection Inject 30 Units into the skin 2 (two) times daily. 12 hours apart   Yes Historical Provider, MD  lisinopril (PRINIVIL,ZESTRIL) 20 MG tablet Take 20 mg by mouth daily.   Yes Historical Provider, MD  meloxicam (MOBIC) 15 MG tablet Take 15 mg by mouth daily.   Yes Historical Provider, MD  metFORMIN (GLUCOPHAGE) 1000 MG tablet Take 1,000 mg by mouth 2 (two) times daily with a meal.   Yes Historical Provider, MD  omeprazole (PRILOSEC) 20 MG capsule Take 20 mg by mouth 2 (two) times daily.    Yes Historical Provider, MD  pregabalin (LYRICA) 225 MG capsule Take 225 mg by mouth 2 (two) times daily.   Yes Historical Provider, MD  rOPINIRole (REQUIP) 0.5 MG tablet Take 0.5 mg by mouth every evening.   Yes Historical Provider, MD  Vitamin D, Ergocalciferol, (DRISDOL) 50000 UNITS CAPS capsule Take 50,000 Units by mouth every 7 (seven)  days.   Yes Historical Provider, MD  citalopram (CELEXA) 20 MG tablet Take 20 mg by mouth daily.    Historical Provider, MD    Allergies Actos [pioglitazone]; Byetta 10 mcg pen [exenatide]; and Victoza [liraglutide]  Family History  Problem Relation Age of Onset  . Breast cancer Sister 32  . Cancer Mother     Social History Social History  Substance Use Topics  . Smoking status: Never Smoker  . Smokeless tobacco: Never Used  . Alcohol use No    Review of Systems Constitutional: No fever/chills Eyes: No visual changes. ENT: No sore throat. Cardiovascular: Denies  chest pain. Respiratory: Denies shortness of breath. Gastrointestinal: No abdominal pain.  No nausea, no vomiting.  No diarrhea.  No constipation. Genitourinary: Negative for dysuria. Musculoskeletal: Negative for back pain. Skin: Negative for rash. Neurological: Negative for headaches, Negative for focal weakness. Positive for left facial numbness  10-point ROS otherwise negative.  ____________________________________________   PHYSICAL EXAM:  VITAL SIGNS: ED Triage Vitals  Enc Vitals Group     BP 11/27/16 0131 (!) 183/71     Pulse Rate 11/27/16 0131 75     Resp 11/27/16 0131 18     Temp 11/27/16 0131 97.9 F (36.6 C)     Temp Source 11/27/16 0131 Oral     SpO2 11/27/16 0131 100 %     Weight 11/27/16 0129 199 lb (90.3 kg)     Height 11/27/16 0129 5\' 3"  (1.6 m)     Head Circumference --      Peak Flow --      Pain Score --      Pain Loc --      Pain Edu? --      Excl. in [place]? --     Constitutional: Alert and oriented. Well appearing and in no acute distress. Eyes: Conjunctivae are normal. PERRL. EOMI. Head: Atraumatic. Nose: No congestion/rhinnorhea. Mouth/Throat: Mucous membranes are moist.  Oropharynx non-erythematous. Neck: No stridor.  No meningeal signs.  Cardiovascular: Normal rate, regular rhythm. Good peripheral circulation. Grossly normal heart sounds. Respiratory: Normal respiratory effort.  No retractions. Lungs CTAB. Gastrointestinal: Soft and nontender. No distention.  Musculoskeletal: No lower extremity tenderness nor edema. No gross deformities of extremities. Neurologic:  Normal speech and language. No gross focal neurologic deficits are appreciated.  Skin:  Skin is warm, dry and intact. No rash noted. Psychiatric: Mood and affect are normal. Speech and behavior are normal.  ____________________________________________   LABS (all labs ordered are listed, but only abnormal results are displayed)  Labs Reviewed  BASIC METABOLIC PANEL - Abnormal;  Notable for the following:       Result Value   Glucose, Bld 356 (*)    BUN 23 (*)    Creatinine, Ser 1.29 (*)    GFR calc non Af Amer 40 (*)    GFR calc Af Amer 46 (*)    All other components within normal limits  CBC  TROPONIN I   ____________________________________________  EKG  ED ECG REPORT I, [place] N Anahlia Iseminger, the attending physician, personally viewed and interpreted this ECG.   Date: 11/27/2016  EKG Time: 1:35 AM  Rate: 68  Rhythm: Normal sinus rhythm  Axis: Normal  Intervals: Normal  ST&T Change: None  ____________________________________________  RADIOLOGY I, [place] N Herbert Aguinaldo, personally viewed and evaluated these images (plain radiographs) as part of my medical decision making, as well as reviewing the written report by the radiologist.  Ct Head Wo Contrast  Result Date: 11/27/2016  CLINICAL DATA:  Acute onset of left-sided facial numbness. Initial encounter. EXAM: CT HEAD WITHOUT CONTRAST TECHNIQUE: Contiguous axial images were obtained from the base of the skull through the vertex without intravenous contrast. COMPARISON:  None. FINDINGS: Brain: No evidence of acute infarction, hemorrhage, hydrocephalus, extra-axial collection or mass lesion/mass effect. Prominence of the ventricles and sulci reflects mild cortical volume loss. A cavum vergae is noted. The brainstem and fourth ventricle are within normal limits. The basal ganglia are unremarkable in appearance. The cerebral hemispheres demonstrate grossly normal gray-white differentiation. No mass effect or midline shift is seen. Vascular: No hyperdense vessel or unexpected calcification. Skull: There is no evidence of fracture; visualized osseous structures are unremarkable in appearance. Sinuses/Orbits: The visualized portions of the orbits are within normal limits. The paranasal sinuses and mastoid air cells are well-aerated. Other: No significant soft tissue abnormalities are seen. IMPRESSION: 1. No acute  intracranial pathology seen on CT. 2. Mild cortical volume loss noted. Electronically Signed   By: Garald Balding M.D.   On: 11/27/2016 02:31    _______ Procedures     INITIAL IMPRESSION / ASSESSMENT AND PLAN / ED COURSE  Pertinent labs & imaging results that were available during my care of the patient were reviewed by me and considered in my medical decision making (see chart for details).  76 year old female presenting with acute onset of left facial numbness since noon yesterday. CT scan of the head revealed no acute abnormality. 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   INSULIN LISPRO PROTAMINE-LISPRO (HUMALOG 75/25 MIX) (75-25) 100 UNIT/ML SUSP INJECTION    Inject 44-54 Units into the skin 2 (two) times daily with a meal. Pt uses 44 units with breakfast and 54 units with dinner.   LISINOPRIL (PRINIVIL,ZESTRIL) 10 MG TABLET    Take 10 mg by mouth daily.     Note:  This document was prepared using Dragon voice recognition software and may include unintentional dictation errors.    Gregor Hams, MD 11/27/16 [phone]

## 2016-11-27 NOTE — Discharge Instructions (Signed)
Diet and activity as before °

## 2016-11-27 NOTE — H&P (Signed)
Kimberly Duarte is an 76 y.o. female.   Chief Complaint: Numbness HPI: The patient with past medical history of hypertension and diabetes presents emergency department complaining of numbness along the left side of her face. She states that her dentist began day prior to admission at noon. The numbness went away but returned again today. She denies any chest pain, shortness of breath, change in vision, difficulty swallowing or speaking, weakness in her extremities or imbalance. CT scan of the brain showed no acute abnormality. MRI revealed an old right frontal stroke however the patient states that she has not had symptoms prior to yesterday. Due to her uremic symptoms and possible cerebrovascular accident the emergency department staff called the hospitalist service for admission.  Past Medical History:  Diagnosis Date  . Depression   . Diabetes mellitus without complication ([place])   . Diabetic peripheral neuropathy ([place])   . GERD (gastroesophageal reflux disease)   . Hyperlipidemia   . Hypertension   . Obesity   . Uterine cancer [facility])     Past Surgical History:  Procedure Laterality Date  . ABDOMINAL HYSTERECTOMY    . COLONOSCOPY    . COLONOSCOPY WITH PROPOFOL N/A 09/04/2015   Procedure: COLONOSCOPY WITH PROPOFOL;  Surgeon: Manya Silvas, MD;  Location: [facility] ENDOSCOPY;  Service: Endoscopy;  Laterality: N/A;  . ESOPHAGOGASTRODUODENOSCOPY (EGD) WITH PROPOFOL N/A 09/04/2015   Procedure: ESOPHAGOGASTRODUODENOSCOPY (EGD) WITH PROPOFOL;  Surgeon: Manya Silvas, MD;  Location: [facility] ENDOSCOPY;  Service: Endoscopy;  Laterality: N/A;  . UPPER GI ENDOSCOPY      Family History  Problem Relation Age of Onset  . Breast cancer Sister 83  . Colon cancer Mother   . Heart disease Father    Social History:  reports that she has never smoked. She has never used smokeless tobacco. She reports that she does not drink alcohol. Her drug history is not on file.  Allergies:  Allergies  Allergen  Reactions  . Actos [Pioglitazone] Other (See Comments)    Reaction:  Stomach cramps   . Byetta 10 Mcg Pen [Exenatide] Nausea And Vomiting  . Victoza [Liraglutide] Other (See Comments)    Reaction:  Burning and stinging of feet      Prior to Admission medications   Medication Sig Start Date End Date Taking? Authorizing Provider  amitriptyline (ELAVIL) 25 MG tablet Take 25 mg by mouth at bedtime as needed for sleep.   Yes Historical Provider, MD  aspirin EC 81 MG tablet Take 81 mg by mouth daily.   Yes Historical Provider, MD  citalopram (CELEXA) 20 MG tablet Take 20 mg by mouth daily.   Yes Historical Provider, MD  Exenatide ER 2 MG/0.85ML AUIJ Inject 2 mg into the skin once a week.   Yes Historical Provider, MD  glipiZIDE (GLUCOTROL) 10 MG tablet Take 10 mg by mouth 2 (two) times daily.    Yes Historical Provider, MD  hydrochlorothiazide (MICROZIDE) 12.5 MG capsule Take 12.5 mg by mouth daily.   Yes Historical Provider, MD  insulin lispro (HUMALOG) 100 UNIT/ML injection Inject 5-20 Units into the skin as needed for high blood sugar. If sugar 175 - 225 -- take 5 units If sugar 226 - 275 -- take 10 units If sugar 276 - 325 -- take 15 units If sugar over 326 -- take 20 units   Yes Historical Provider, MD  insulin NPH Human (HUMULIN N,NOVOLIN N) 100 UNIT/ML injection Inject 30 Units into the skin 2 (two) times daily. 12 hours apart  Yes Historical Provider, MD  lisinopril (PRINIVIL,ZESTRIL) 20 MG tablet Take 20 mg by mouth daily.   Yes Historical Provider, MD  meloxicam (MOBIC) 15 MG tablet Take 15 mg by mouth daily.   Yes Historical Provider, MD  metFORMIN (GLUCOPHAGE) 1000 MG tablet Take 1,000 mg by mouth 2 (two) times daily with a meal.   Yes Historical Provider, MD  omeprazole (PRILOSEC) 20 MG capsule Take 20 mg by mouth 2 (two) times daily.    Yes Historical Provider, MD  pregabalin (LYRICA) 225 MG capsule Take 225 mg by mouth 2 (two) times daily.   Yes Historical Provider, MD   rOPINIRole (REQUIP) 0.5 MG tablet Take 0.5 mg by mouth every evening.   Yes Historical Provider, MD  Vitamin D, Ergocalciferol, (DRISDOL) 50000 UNITS CAPS capsule Take 50,000 Units by mouth every 7 (seven) days.   Yes Historical Provider, MD     Results for orders placed or performed during the hospital encounter of 11/27/16 (from the past 48 hour(s))  CBC     Status: None   Collection Time: 11/27/16  1:28 AM  Result Value Ref Range   WBC 10.0 3.6 - 11.0 K/uL   RBC 4.76 3.80 - 5.20 MIL/uL   Hemoglobin 14.4 12.0 - 16.0 g/dL   HCT 40.7 35.0 - 47.0 %   MCV 85.4 80.0 - 100.0 fL   MCH 30.2 26.0 - 34.0 pg   MCHC 35.4 32.0 - 36.0 g/dL   RDW 13.6 11.5 - 14.5 %   Platelets 229 150 - 440 K/uL  Basic metabolic panel     Status: Abnormal   Collection Time: 11/27/16  1:28 AM  Result Value Ref Range   Sodium 139 135 - 145 mmol/L   Potassium 4.1 3.5 - 5.1 mmol/L   Chloride 103 101 - 111 mmol/L   CO2 27 22 - 32 mmol/L   Glucose, Bld 356 (H) 65 - 99 mg/dL   BUN 23 (H) 6 - 20 mg/dL   Creatinine, Ser 1.29 (H) 0.44 - 1.00 mg/dL   Calcium 9.3 8.9 - 10.3 mg/dL   GFR calc non Af Amer 40 (L) >60 mL/min   GFR calc Af Amer 46 (L) >60 mL/min    Comment: (NOTE) The eGFR has been calculated using the CKD EPI equation. This calculation has not been validated in all clinical situations. eGFR's persistently <60 mL/min signify possible Chronic Kidney Disease.    Anion gap 9 5 - 15  Troponin I     Status: None   Collection Time: 11/27/16  1:28 AM  Result Value Ref Range   Troponin I <0.03 <0.03 ng/mL  Glucose, capillary     Status: Abnormal   Collection Time: 11/27/16  5:38 AM  Result Value Ref Range   Glucose-Capillary 254 (H) 65 - 99 mg/dL   Ct Head Wo Contrast  Result Date: 11/27/2016 CLINICAL DATA:  Acute onset of left-sided facial numbness. Initial encounter. EXAM: CT HEAD WITHOUT CONTRAST TECHNIQUE: Contiguous axial images were obtained from the base of the skull through the vertex without  intravenous contrast. COMPARISON:  None. FINDINGS: Brain: No evidence of acute infarction, hemorrhage, hydrocephalus, extra-axial collection or mass lesion/mass effect. Prominence of the ventricles and sulci reflects mild cortical volume loss. A cavum vergae is noted. The brainstem and fourth ventricle are within normal limits. The basal ganglia are unremarkable in appearance. The cerebral hemispheres demonstrate grossly normal gray-white differentiation. No mass effect or midline shift is seen. Vascular: No hyperdense vessel or unexpected calcification. Skull:  There is no evidence of fracture; visualized osseous structures are unremarkable in appearance. Sinuses/Orbits: The visualized portions of the orbits are within normal limits. The paranasal sinuses and mastoid air cells are well-aerated. Other: No significant soft tissue abnormalities are seen. IMPRESSION: 1. No acute intracranial pathology seen on CT. 2. Mild cortical volume loss noted. Electronically Signed   By: Garald Balding M.D.   On: 11/27/2016 02:31   Mr Brain Wo Contrast  Result Date: 11/27/2016 CLINICAL DATA:  Intermittent LEFT facial numbness since noon. History of hypertension, hyperlipidemia and diabetes. EXAM: MRI HEAD WITHOUT CONTRAST TECHNIQUE: Multiplanar, multiecho pulse sequences of the brain and surrounding structures were obtained without intravenous contrast. COMPARISON:  CT HEAD November 27, 2016 at 0202 hours FINDINGS: BRAIN: No reduced diffusion to suggest acute ischemia. No susceptibility artifact to suggest hemorrhage. The ventricles and sulci are normal for patient's age, cavum velum interpositum. Small area RIGHT frontal lobe encephalomalacia. A few scattered subcentimeter supratentorial white matter FLAIR T2 hyperintensities compatible with mild chronic small vessel ischemic disease, less than expected for age. No suspicious parenchymal signal, masses or mass effect. No abnormal extra-axial fluid collections. VASCULAR: Normal  major intracranial vascular flow voids present at skull base. SKULL AND UPPER CERVICAL SPINE: No abnormal sellar expansion. No suspicious calvarial bone marrow signal. Craniocervical junction maintained. SINUSES/ORBITS: The mastoid air-cells and included paranasal sinuses are well-aerated. The included ocular globes and orbital contents are non-suspicious. OTHER: Patient is edentulous. IMPRESSION: No acute intracranial process. 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 Electronically Signed   By: Elon Alas M.D.   On: 11/27/2016 03:59    Review of Systems  Constitutional: Negative for chills and fever.  HENT: Negative for sore throat and tinnitus.   Eyes: Negative for blurred vision and redness.  Respiratory: Negative for cough and shortness of breath.   Cardiovascular: Negative for chest pain, palpitations, orthopnea and PND.  Gastrointestinal: Negative for abdominal pain, diarrhea, nausea and vomiting.  Genitourinary: Negative for dysuria, frequency and urgency.  Musculoskeletal: Negative for joint pain and myalgias.  Skin: Negative for rash.       No lesions  Neurological: Positive for sensory change. Negative for speech change, focal weakness and weakness.  Endo/Heme/Allergies: Does not bruise/bleed easily.       No temperature intolerance  Psychiatric/Behavioral: Negative for depression and suicidal ideas.    Blood pressure (!) 179/86, pulse 71, temperature 98.1 F (36.7 C), resp. rate 14, height 5' 3"  (1.6 m), weight 90.3 kg (199 lb), SpO2 95 %. Physical Exam  Vitals reviewed. Constitutional: She is oriented to person, place, and time. She appears well-developed and well-nourished. No distress.  HENT:  Head: Normocephalic and atraumatic.  Mouth/Throat: Oropharynx is clear and moist.  Eyes: Conjunctivae and EOM are normal. Pupils are equal, round, and reactive to light. No scleral icterus.  Neck: Normal range of motion. Neck supple.  No JVD present. No tracheal deviation present. No thyromegaly present.  Cardiovascular: Normal rate, regular rhythm and normal heart sounds.  Exam reveals no gallop and no friction rub.   No murmur heard. Respiratory: Effort normal and breath sounds normal.  GI: Soft. Bowel sounds are normal. She exhibits no distension. There is no tenderness.  Genitourinary:  Genitourinary Comments: Deferred  Musculoskeletal: Normal range of motion. She exhibits no edema.  Lymphadenopathy:    She has no cervical adenopathy.  Neurological: She is alert and oriented to person, place, and time. She has normal reflexes. A sensory deficit (subjective)  is present. No cranial nerve deficit. She exhibits normal muscle tone. She displays a negative Romberg sign. She displays no Babinski's sign on the right side. She displays no Babinski's sign on the left side.  Skin: Skin is warm and dry. No rash noted. No erythema.  Psychiatric: She has a normal mood and affect. Her behavior is normal. Judgment and thought content normal.     Assessment/Plan This is a 76 year old female admitted for cerebrovascular accident. 1. CVA: Differential diagnosis includes stroke, early Bell's palsy, complicated migraine etc. imaging findings are old however the patient's symptoms are new. Ultrasound of carotid arteries ordered. Consider CTA of intra- and extracranial vessels to rule out vasculitis, stenosis or aneurysms. The patient currently has no neurologic deficits. Continue aspirin 2. Acute kidney injury: Unclear etiology. The patient has felt well and denies nausea or vomiting or change in appetite. Hydrate with intravenous fluid. Encourage oral intake. Hold lisinopril and other nephrotoxic agents such as NSAIDs. 3. Hypertension: Uncontrolled; permissive hypertension for now. 4. Diabetes mellitus type 2: The patient's endocrinologist has recently changed her insulin regimen although she has not started her new prescriptions. Adjust NPH  insulin to nighttime Lantus adjusted for hospital diet. Sliding scale insulin while hospitalized. Hold oral hypoglycemic agents. 5. Assist leg syndrome: Continue Requip 6. DVT prophylaxis: Upper and 7. GI prophylaxis: PPI per home regimen The patient is a full code. Time spent on admission orders and patient care possibly 45 minutes  Harrie Foreman, MD 11/27/2016, 8:04 AM

## 2016-11-27 NOTE — ED Triage Notes (Signed)
Patient ambulatory to triage with steady gait, without difficulty or distress noted; pt reports left sided facial numbness intermittently since noon; pt denies any hx of same; denies any accomp symptoms; pt A&Ox3, PERRL. MAEW

## 2016-11-27 NOTE — ED Notes (Signed)
This RN to bedside at this time to introduce self to patient. Pt is alert and oriented at this time. NAD noted. This RN assisted patient to the bathroom without incident. Pt is ambulatory with no difficulty. Pt denies any further needs. This RN explained delay in going to the floor to the patient, pt states understanding, is pleasant and cooperative.

## 2016-11-27 NOTE — ED Notes (Signed)
Report given to Danielle, RN.  

## 2016-11-27 NOTE — Evaluation (Signed)
Physical Therapy Evaluation Patient Details Name: Kimberly Duarte MRN: [id number] DOB: Jul 25, 1941 Today's Date: 11/27/2016   History of Present Illness  Pt admitted for CVA. Pt with negative testing at this time. Pt with history of HTN, DM, and depression.  Pt with complaints of L numbness in face.  Clinical Impression  Pt is a pleasant 76 year old female who was admitted for possible CVA. Pt demonstrates all bed mobility/transfers/ambulation at baseline level. Pt with coordination testing WNL with finger opposition and heel shin test. Pt with symmetrical strength in B UE/LE and fluent speech. Pt still reports some numbness/tingling in L face/cheek area. Does not appear to affect mobility at this time. Pt does not require any further PT needs at this time. Pt will be dc in house and does not require follow up. RN aware. Will dc current orders.      Follow Up Recommendations No PT follow up    Equipment Recommendations  None recommended by PT    Recommendations for Other Services       Precautions / Restrictions Precautions Precautions: Fall Restrictions Weight Bearing Restrictions: No      Mobility  Bed Mobility               General bed mobility comments: not performed as pt seated at EOB upon arrival  Transfers Overall transfer level: Independent               General transfer comment: safe technique performed without AD. Upright posture noted  Ambulation/Gait Ambulation/Gait assistance: Independent Ambulation Distance (Feet): 40 Feet Assistive device: None Gait Pattern/deviations: WFL(Within Functional Limits)     General Gait Details: Pt eating lunch, however agreeable to perform short distance walk. Pt able to ambulate with WNL. No LOB or unsteadiness noted.  Stairs            Wheelchair Mobility    Modified Rankin (Stroke Patients Only)       Balance Overall balance assessment: Independent                                            Pertinent Vitals/Pain Pain Assessment: No/denies pain    Home Living Family/patient expects to be discharged to:: Private residence Living Arrangements: Alone Available Help at Discharge: Family Type of Home: House Home Access: Stairs to enter   [employer] of Steps: Pt reports a few stairs Home Layout: One level Home Equipment: None      Prior Function Level of Independence: Independent         Comments: does not use AD at baseline. Indep     Hand Dominance        Extremity/Trunk Assessment   Upper Extremity Assessment Upper Extremity Assessment: Overall WFL for tasks assessed    Lower Extremity Assessment Lower Extremity Assessment: Overall WFL for tasks assessed       Communication   Communication: No difficulties  Cognition Arousal/Alertness: Awake/alert Behavior During Therapy: WFL for tasks assessed/performed Overall Cognitive Status: Within Functional Limits for tasks assessed                      General Comments      Exercises     Assessment/Plan    PT Assessment Patent does not need any further PT services  PT Problem List         PT Treatment Interventions  PT Goals (Current goals can be found in the Care Plan section)  Acute Rehab PT Goals Patient Stated Goal: to go home PT Goal Formulation: All assessment and education complete, DC therapy Time For Goal Achievement: 11/27/16 Potential to Achieve Goals: Good    Frequency     Barriers to discharge        Co-evaluation               End of Session   Activity Tolerance: Patient tolerated treatment well Patient left: in bed;with family/visitor present (seated at EOB eating lunch) Nurse Communication: Mobility status PT Visit Diagnosis: Unsteadiness on feet (R26.81)    Functional Assessment Tool Used: AM-PAC 6 Clicks Basic Mobility Functional Limitation: Mobility: Walking and moving around Mobility: Walking and Moving Around  Current Status [id number]): 0 percent impaired, limited or restricted Mobility: Walking and Moving Around Goal Status [phone]): 0 percent impaired, limited or restricted Mobility: Walking and Moving Around Discharge Status [id number]): 0 percent impaired, limited or restricted    Time: 1159-1208 PT Time Calculation (min) (ACUTE ONLY): 9 min   Charges:   PT Evaluation $PT Eval Low Complexity: 1 Procedure     PT G Codes:   PT G-Codes **NOT FOR INPATIENT CLASS** Functional Assessment Tool Used: AM-PAC 6 Clicks Basic Mobility Functional Limitation: Mobility: Walking and moving around Mobility: Walking and Moving Around Current Status [id number]): 0 percent impaired, limited or restricted Mobility: Walking and Moving Around Goal Status [id number]): 0 percent impaired, limited or restricted Mobility: Walking and Moving Around Discharge Status [id number]): 0 percent impaired, limited or restricted     Jaysen Wey 11/27/2016, 2:17 PM  Greggory Stallion, PT, DPT [phone]

## 2016-11-27 NOTE — Progress Notes (Signed)
Pt MRI and CT are negative for acute CVA.  Spoke with Dr. Darvin Neighbours to see if we still need to do NIH, frequent vitals and neuro checks.  Per Dr. Darvin Neighbours can discontinue neuro check and NIH, continue routine vitals.  Clarise Cruz, RN

## 2016-11-28 LAB — HEMOGLOBIN A1C
Hgb A1c MFr Bld: 9 % — ABNORMAL HIGH (ref 4.8–5.6)
Mean Plasma Glucose: 212 mg/dL

## 2016-11-28 LAB — ECHOCARDIOGRAM COMPLETE
Height: 63 in
Weight: 3235 oz

## 2016-11-28 LAB — LIPID PANEL
Cholesterol: 191 mg/dL (ref 0–200)
HDL: 28 mg/dL — ABNORMAL LOW (ref >40)
LDL Cholesterol: UNDETERMINED mg/dL (ref 0–99)
Total CHOL/HDL Ratio: 6.8 RATIO
Triglycerides: 474 mg/dL — ABNORMAL HIGH (ref <150)
VLDL: UNDETERMINED mg/dL (ref 0–40)

## 2016-11-28 LAB — GLUCOSE, CAPILLARY
Glucose-Capillary: 243 mg/dL — ABNORMAL HIGH (ref 65–99)
Glucose-Capillary: 220 mg/dL — ABNORMAL HIGH (ref 65–99)

## 2016-11-28 LAB — CREATININE, SERUM
Creatinine, Ser: 0.98 mg/dL (ref 0.44–1.00)
GFR calc Af Amer: >60 mL/min (ref >60)
GFR calc non Af Amer: 55 mL/min — ABNORMAL LOW (ref >60)

## 2016-11-28 MED ORDER — GLIPIZIDE 5 MG PO TABS
5.0000 mg | ORAL_TABLET | Freq: Two times a day (BID) | ORAL | Status: DC
  Administered 2016-11-28: 5 mg via ORAL
  Filled 2016-11-28: qty 1

## 2016-11-28 NOTE — Progress Notes (Signed)
MD order received in [facility] to discharge pt home today; verbally reviewed AVS with pt, gave Rxs for Lipitor and Aspirin to the pt; no questions voiced at this time; pt discharged via wheelchair by auxillary to the visitor's entrance

## 2016-11-28 NOTE — Care Management Obs Status (Signed)
[facility] NOTIFICATION   Patient Details  Name: CHARQUITA AHMANN MRN: [id number] Date of Birth: 1941/07/11   Medicare Observation Status Notification Given:  Yes    Shelbie Ammons, RN 11/28/2016, 9:35 AM

## 2016-11-28 NOTE — Progress Notes (Signed)
Inpatient Diabetes Program Recommendations  AACE/ADA: New Consensus Statement on Inpatient Glycemic Control (2015)  Target Ranges:  Prepandial:   less than 140 mg/dL      Peak postprandial:   less than 180 mg/dL (1-2 hours)      Critically ill patients:  140 - 180 mg/dL   Lab Results  Component Value Date   GLUCAP 220 (H) 11/28/2016   HGBA1C 9.0 (H) 11/27/2016    Spoke with patient and her daughter.  She is a patient of Dr. Joycie Peek- last saw her on Monday, Feb 26th .  MD switched her from Lantus to NPH bid because of cost- she is getting NPH at [facility] for $25.00/bottle.  MD has also ordered an injectable which the patient will take once a week starting this Sunday.  The patient admits to not always taking her Humalog as ordered.   Knows what an A1C is- says it was 8 something last time it was checked at MD.  Discussed the potential long term complications-  Shaken with this admission.  Recommend folllow up with MD and to take medications as ordered.   Gentry Fitz, RN, BA, MHA, CDE Diabetes Coordinator Inpatient Diabetes Program  [phone] (Team Pager) [phone] ([place]) 11/28/2016 2:45 PM

## 2016-11-28 NOTE — Care Management (Signed)
Admitted to [facility] with the diagnosis of CVA under observation status. Lives alone. Daughters are [employer] [phone]) and Prudencio Burly [phone]). Last seen Dr. Ginette Pitman 11/13/16. No home health. No skilled facility. No home oxygen. Prescriptions are filled at [facility] in [place]. No equipment in the home. Good appetite. No falls. Takes care of all basic and instrumental activities of daily living herself, drives.  Daughter will transport Kimberly Ammons RN MSN CCM Care Management

## 2016-11-28 NOTE — Progress Notes (Addendum)
Inpatient Diabetes Program Recommendations  AACE/ADA: New Consensus Statement on Inpatient Glycemic Control (2015)  Target Ranges:  Prepandial:   less than 140 mg/dL      Peak postprandial:   less than 180 mg/dL (1-2 hours)      Critically ill patients:  140 - 180 mg/dL   Lab Results  Component Value Date   GLUCAP 243 (H) 11/28/2016   HGBA1C 9.0 (H) 11/27/2016    Review of Glycemic Control  Results for VALA, AVANCE (MRN [id number]) as of 11/28/2016 10:03  Ref. Range 11/27/2016 09:05 11/27/2016 11:39 11/27/2016 17:01 11/27/2016 20:45 11/28/2016 07:30  Glucose-Capillary Latest Ref Range: 65 - 99 mg/dL 229 (H) 222 (H) 264 (H) 210 (H) 243 (H)    Diabetes history: Type 2 Outpatient Diabetes medications: Glucotrol 10mg  qday , NPH insulin 30 units bid, Humalog 5-12 units "as needed"  Current orders for Inpatient glycemic control: Glucotrol 5mg  bid, Lantus 45 units bid, Metformin 1000mg  bid, Novolog 0-15 units tid  Inpatient Diabetes Program Recommendations:  Please consider d/c Glucotrol and add Novolog 6 units tid, add Novolog 0-5 units qhs    Gentry Fitz, RN, [place], [place], CDE Diabetes Coordinator Inpatient Diabetes Program  [phone] (Team Pager) [phone] ([place]) 11/28/2016 10:13 AM

## 2016-12-12 NOTE — Discharge Summary (Signed)
[facility] [place] NAME: Kimberly Duarte    MR#:  [id number]  DATE OF BIRTH:  1940/10/18  DATE OF ADMISSION:  11/27/2016 ADMITTING PHYSICIAN: Harrie Foreman, MD  DATE OF DISCHARGE: 11/28/2016  3:49 PM  PRIMARY CARE PHYSICIAN: Tracie Harrier, MD   ADMISSION DIAGNOSIS:  Cerebrovascular accident (CVA), unspecified mechanism ([place]) [I63.9]  DISCHARGE DIAGNOSIS:  Active Problems:   CVA (cerebral vascular accident) ([place])   SECONDARY DIAGNOSIS:   Past Medical History:  Diagnosis Date  . Depression   . Diabetes mellitus without complication ([place])   . Diabetic peripheral neuropathy ([place])   . GERD (gastroesophageal reflux disease)   . Hyperlipidemia   . Hypertension   . Obesity   . Uterine cancer [facility])      ADMITTING HISTORY  Chief Complaint: Numbness HPI: The patient with past medical history of hypertension and diabetes presents emergency department complaining of numbness along the left side of her face. She states that her dentist began day prior to admission at noon. The numbness went away but returned again today. She denies any chest pain, shortness of breath, change in vision, difficulty swallowing or speaking, weakness in her extremities or imbalance. CT scan of the brain showed no acute abnormality. MRI revealed an old right frontal stroke however the patient states that she has not had symptoms prior to yesterday. Due to her uremic symptoms and possible cerebrovascular accident the emergency department staff called the hospitalist service for admission.  HOSPITAL COURSE:   * TIA Patient's symptoms improved well. By the time of discharge her symptoms have almost resolved. MRI of the brain showed nothing acute. Echocardiogram and carotid Dopplers showed no source for a TIA or stroke. Patient will be on aspirin and statin. Seen by neurology Dr. Doy Mince. 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 Patient will follow-up with neurology as  outpatient and primary care physician. Seen by PT and OT in the hospital.  Patient discharged home in stable condition.  CONSULTS OBTAINED:  Treatment Team:  Alexis Goodell, MD  DRUG ALLERGIES:   Allergies  Allergen Reactions  . Actos [Pioglitazone] Other (See Comments)    Reaction:  Stomach cramps   . Byetta 10 Mcg Pen [Exenatide] Nausea And Vomiting  . Victoza [Liraglutide] Other (See Comments)    Reaction:  Burning and stinging of feet      DISCHARGE MEDICATIONS:   Discharge Medication List as of 11/28/2016  3:35 PM    START taking these medications   Details  atorvastatin (LIPITOR) 40 MG tablet Take 1 tablet (40 mg total) by mouth daily at 6 PM., Starting Wed 11/27/2016, Normal      CONTINUE these medications which have CHANGED   Details  aspirin EC 325 MG tablet Take 1 tablet (325 mg total) by mouth daily., Starting Wed 11/27/2016, Normal      CONTINUE these medications which have NOT CHANGED   Details  amitriptyline (ELAVIL) 25 MG tablet Take 25 mg by mouth at bedtime as needed for sleep., Historical Med    citalopram (CELEXA) 20 MG tablet Take 20 mg by mouth daily., Until Discontinued, Historical Med    Exenatide ER 2 MG/0.85ML AUIJ Inject 2 mg into the skin once a week., Historical Med    glipiZIDE (GLUCOTROL) 10 MG tablet Take 10 mg by mouth 2 (two) times daily. , Until Discontinued, Historical Med    hydrochlorothiazide (MICROZIDE) 12.5 MG capsule Take 12.5 mg by mouth daily., Historical Med    insulin lispro (HUMALOG) 100  UNIT/ML injection Inject 5-20 Units into the skin as needed for high blood sugar. If sugar 175 - 225 -- take 5 units If sugar 226 - 275 -- take 10 units If sugar 276 - 325 -- take 15 units If sugar over 326 -- take 20 units, Historical Med    insulin NPH Human (HUMULIN N,NOVOLIN N) 100 UNIT/ML injection Inject 30 Units into the skin 2 (two) times daily. 12 hours apart, Historical Med    lisinopril (PRINIVIL,ZESTRIL) 20 MG tablet Take 20  mg by mouth daily., Historical Med    meloxicam (MOBIC) 15 MG tablet Take 15 mg by mouth daily., Historical Med    metFORMIN (GLUCOPHAGE) 1000 MG tablet Take 1,000 mg by mouth 2 (two) times daily with a meal., Until Discontinued, Historical Med    omeprazole (PRILOSEC) 20 MG capsule Take 20 mg by mouth 2 (two) times daily. , Until Discontinued, Historical Med    pregabalin (LYRICA) 225 MG capsule Take 225 mg by mouth 2 (two) times daily., Until Discontinued, Historical Med    rOPINIRole (REQUIP) 0.5 MG tablet Take 0.5 mg by mouth every evening., Historical Med    Vitamin D, Ergocalciferol, (DRISDOL) 50000 UNITS CAPS capsule Take 50,000 Units by mouth every 7 (seven) days., Until Discontinued, Historical Med        Today   VITAL SIGNS:  Blood pressure 138/60, pulse 76, temperature 97.4 F (36.3 C), temperature source Oral, resp. rate 18, height 5\' 3"  (1.6 m), weight 91 kg (200 lb 11.2 oz), SpO2 95 %.  I/O:  No intake or output data in the 24 hours ending 12/12/16 1330  PHYSICAL EXAMINATION:  Physical Exam  GENERAL:  76 y.o.-year-old patient lying in the bed with no acute distress.  LUNGS: Normal breath sounds bilaterally, no wheezing, rales,rhonchi or crepitation. No use of accessory muscles of respiration.  CARDIOVASCULAR: S1, S2 normal. No murmurs, rubs, or gallops.  ABDOMEN: Soft, non-tender, non-distended. Bowel sounds present. No organomegaly or mass.  NEUROLOGIC: Moves all 4 extremities. PSYCHIATRIC: The patient is alert and oriented x 3.  SKIN: No obvious rash, lesion, or ulcer.   DATA REVIEW:   CBC No results for input(s): WBC, HGB, HCT, PLT in the last 168 hours.  Chemistries  No results for input(s): NA, K, CL, CO2, GLUCOSE, BUN, CREATININE, CALCIUM, MG, AST, ALT, ALKPHOS, BILITOT in the last 168 hours.  Invalid input(s): GFRCGP  Cardiac Enzymes No results for input(s): TROPONINI in the last 168 hours.  Microbiology Results  No results found for this or any  previous visit.  RADIOLOGY:  No results found.  Follow up with PCP in 1 week.  Management plans discussed with the patient, family and they are in agreement.  CODE STATUS:  Code Status History    Date Active Date Inactive Code Status Order ID Comments User Context   11/27/2016  8:56 AM 11/28/2016  6:55 PM Full Code [id number]  Harrie Foreman, MD Inpatient      TOTAL TIME TAKING CARE OF THIS PATIENT ON DAY OF DISCHARGE: more than 30 minutes.   Hillary Bow R M.D on 12/12/2016 at 1:30 PM  Between 7am to 6pm - Pager - [phone]  After 6pm go to [email or web address] - password [email or web address] [facility]  Office  [phone]  CC: Primary care physician; Tracie Harrier, MD  Note: This dictation was prepared with Dragon dictation along with smaller phrase technology. Any transcriptional errors that result from this process are unintentional.

## 2017-02-15 ENCOUNTER — Inpatient Hospital Stay
Admission: EM | Admit: 2017-02-15 | Discharge: 2017-02-16 | DRG: 066 | Disposition: A | Discharge status: Home / Self Care | Payer: Medicare Other | Attending: Internal Medicine | Admitting: Internal Medicine

## 2017-02-15 ENCOUNTER — Encounter: Payer: Self-pay | Admitting: Medical Oncology

## 2017-02-15 ENCOUNTER — Emergency Department: Payer: Medicare Other

## 2017-02-15 DIAGNOSIS — R531 Weakness: Secondary | ICD-10-CM

## 2017-02-15 DIAGNOSIS — Z8249 Family history of ischemic heart disease and other diseases of the circulatory system: Secondary | ICD-10-CM | POA: Diagnosis not present

## 2017-02-15 DIAGNOSIS — Z7982 Long term (current) use of aspirin: Secondary | ICD-10-CM | POA: Diagnosis not present

## 2017-02-15 DIAGNOSIS — K219 Gastro-esophageal reflux disease without esophagitis: Secondary | ICD-10-CM | POA: Diagnosis present

## 2017-02-15 DIAGNOSIS — Z794 Long term (current) use of insulin: Secondary | ICD-10-CM

## 2017-02-15 DIAGNOSIS — E669 Obesity, unspecified: Secondary | ICD-10-CM | POA: Diagnosis not present

## 2017-02-15 DIAGNOSIS — E1142 Type 2 diabetes mellitus with diabetic polyneuropathy: Secondary | ICD-10-CM | POA: Diagnosis not present

## 2017-02-15 DIAGNOSIS — I639 Cerebral infarction, unspecified: Secondary | ICD-10-CM | POA: Diagnosis present

## 2017-02-15 DIAGNOSIS — Z8673 Personal history of transient ischemic attack (TIA), and cerebral infarction without residual deficits: Secondary | ICD-10-CM | POA: Diagnosis not present

## 2017-02-15 DIAGNOSIS — I10 Essential (primary) hypertension: Secondary | ICD-10-CM | POA: Diagnosis present

## 2017-02-15 DIAGNOSIS — Z888 Allergy status to other drugs, medicaments and biological substances status: Secondary | ICD-10-CM

## 2017-02-15 DIAGNOSIS — Z6834 Body mass index (BMI) 34.0-34.9, adult: Secondary | ICD-10-CM | POA: Diagnosis not present

## 2017-02-15 DIAGNOSIS — E785 Hyperlipidemia, unspecified: Secondary | ICD-10-CM | POA: Diagnosis present

## 2017-02-15 DIAGNOSIS — Z79899 Other long term (current) drug therapy: Secondary | ICD-10-CM | POA: Diagnosis not present

## 2017-02-15 DIAGNOSIS — G2581 Restless legs syndrome: Secondary | ICD-10-CM | POA: Diagnosis present

## 2017-02-15 DIAGNOSIS — F329 Major depressive disorder, single episode, unspecified: Secondary | ICD-10-CM | POA: Diagnosis present

## 2017-02-15 DIAGNOSIS — Z90710 Acquired absence of both cervix and uterus: Secondary | ICD-10-CM

## 2017-02-15 DIAGNOSIS — Z7984 Long term (current) use of oral hypoglycemic drugs: Secondary | ICD-10-CM

## 2017-02-15 DIAGNOSIS — Z8542 Personal history of malignant neoplasm of other parts of uterus: Secondary | ICD-10-CM | POA: Diagnosis not present

## 2017-02-15 LAB — COMPREHENSIVE METABOLIC PANEL
ALK PHOS: 65 U/L (ref 38–126)
ALT: 18 U/L (ref 14–54)
ANION GAP: 12 (ref 5–15)
AST: 21 U/L (ref 15–41)
Albumin: 4 g/dL (ref 3.5–5.0)
BUN: 16 mg/dL (ref 6–20)
CALCIUM: 9.4 mg/dL (ref 8.9–10.3)
CO2: 23 mmol/L (ref 22–32)
Chloride: 104 mmol/L (ref 101–111)
Creatinine, Ser: 0.87 mg/dL (ref 0.44–1.00)
GFR calc non Af Amer: >60 mL/min (ref >60)
Glucose, Bld: 211 mg/dL — ABNORMAL HIGH (ref 65–99)
Potassium: 3.7 mmol/L (ref 3.5–5.1)
Sodium: 139 mmol/L (ref 135–145)
TOTAL PROTEIN: 7.3 g/dL (ref 6.5–8.1)
Total Bilirubin: 0.6 mg/dL (ref 0.3–1.2)

## 2017-02-15 LAB — CBC
HCT: 41.1 % (ref 35.0–47.0)
Hemoglobin: 14 g/dL (ref 12.0–16.0)
MCH: 29.4 pg (ref 26.0–34.0)
MCHC: 34 g/dL (ref 32.0–36.0)
MCV: 86.5 fL (ref 80.0–100.0)
Platelets: 199 10*3/uL (ref 150–440)
RBC: 4.75 MIL/uL (ref 3.80–5.20)
RDW: 13.3 % (ref 11.5–14.5)
WBC: 9.4 10*3/uL (ref 3.6–11.0)

## 2017-02-15 LAB — GLUCOSE, CAPILLARY
GLUCOSE-CAPILLARY: 191 mg/dL — AB (ref 65–99)
GLUCOSE-CAPILLARY: 75 mg/dL (ref 65–99)

## 2017-02-15 LAB — PROTIME-INR
INR: 0.93
PROTHROMBIN TIME: 12.5 s (ref 11.4–15.2)

## 2017-02-15 LAB — URINALYSIS, COMPLETE (UACMP) WITH MICROSCOPIC
Bacteria, UA: NONE SEEN
Bilirubin Urine: NEGATIVE
Hgb urine dipstick: NEGATIVE
Ketones, ur: NEGATIVE mg/dL
Leukocytes, UA: NEGATIVE
Nitrite: NEGATIVE
PH: 5 (ref 5.0–8.0)
Protein, ur: NEGATIVE mg/dL
Specific Gravity, Urine: 1.029 (ref 1.005–1.030)

## 2017-02-15 LAB — DIFFERENTIAL
Basophils Absolute: 0 10*3/uL (ref 0–0.1)
Basophils Relative: 1 %
EOS PCT: 4 %
Eosinophils Absolute: 0.4 10*3/uL (ref 0–0.7)
LYMPHS PCT: 30 %
Lymphs Abs: 2.8 10*3/uL (ref 1.0–3.6)
MONO ABS: 0.6 10*3/uL (ref 0.2–0.9)
Monocytes Relative: 7 %
Neutro Abs: 5.5 10*3/uL (ref 1.4–6.5)
Neutrophils Relative %: 58 %

## 2017-02-15 LAB — TROPONIN I: Troponin I: <0.03 ng/mL (ref <0.03)

## 2017-02-15 LAB — APTT: aPTT: 31 seconds (ref 24–36)

## 2017-02-15 MED ORDER — ROPINIROLE HCL 1 MG PO TABS
0.5000 mg | ORAL_TABLET | Freq: Every evening | ORAL | Status: DC
  Administered 2017-02-15: 0.5 mg via ORAL
  Filled 2017-02-15: qty 1

## 2017-02-15 MED ORDER — METFORMIN HCL 500 MG PO TABS
1000.0000 mg | ORAL_TABLET | Freq: Two times a day (BID) | ORAL | Status: DC
  Administered 2017-02-16: 08:00:00 1000 mg via ORAL
  Filled 2017-02-15: qty 2

## 2017-02-15 MED ORDER — ACETAMINOPHEN 650 MG RE SUPP: 650.0000 mg | RECTAL | Status: DC | PRN

## 2017-02-15 MED ORDER — ASPIRIN 81 MG PO CHEW
324.0000 mg | CHEWABLE_TABLET | Freq: Once | ORAL | Status: AC
  Administered 2017-02-15: 324 mg via ORAL
  Filled 2017-02-15: qty 4

## 2017-02-15 MED ORDER — PANTOPRAZOLE SODIUM 40 MG PO TBEC
40.0000 mg | DELAYED_RELEASE_TABLET | Freq: Every day | ORAL | Status: DC
  Administered 2017-02-16: 40 mg via ORAL
  Filled 2017-02-15: qty 1

## 2017-02-15 MED ORDER — HYDROCHLOROTHIAZIDE 12.5 MG PO CAPS
12.5000 mg | ORAL_CAPSULE | Freq: Every day | ORAL | Status: DC
  Administered 2017-02-16: 12.5 mg via ORAL
  Filled 2017-02-15: qty 1

## 2017-02-15 MED ORDER — MELOXICAM 7.5 MG PO TABS
15.0000 mg | ORAL_TABLET | Freq: Every day | ORAL | Status: DC
  Administered 2017-02-16: 08:00:00 15 mg via ORAL
  Filled 2017-02-15: qty 2

## 2017-02-15 MED ORDER — CITALOPRAM HYDROBROMIDE 20 MG PO TABS
20.0000 mg | ORAL_TABLET | Freq: Every day | ORAL | Status: DC
  Administered 2017-02-16: 08:00:00 20 mg via ORAL
  Filled 2017-02-15: qty 1

## 2017-02-15 MED ORDER — ACETAMINOPHEN 325 MG PO TABS: 650.0000 mg | ORAL_TABLET | ORAL | Status: DC | PRN

## 2017-02-15 MED ORDER — VITAMIN D (ERGOCALCIFEROL) 1.25 MG (50000 UNIT) PO CAPS: 50000.0000 [IU] | ORAL_CAPSULE | ORAL | Status: DC

## 2017-02-15 MED ORDER — ASPIRIN 81 MG PO CHEW
81.0000 mg | CHEWABLE_TABLET | Freq: Every day | ORAL | Status: DC
  Administered 2017-02-16: 08:00:00 81 mg via ORAL
  Filled 2017-02-15: qty 1

## 2017-02-15 MED ORDER — INSULIN ASPART 100 UNIT/ML ~~LOC~~ SOLN: 0.0000 [IU] | Freq: Three times a day (TID) | SUBCUTANEOUS | Status: DC

## 2017-02-15 MED ORDER — INSULIN NPH (HUMAN) (ISOPHANE) 100 UNIT/ML ~~LOC~~ SUSP: 15.0000 [IU] | Freq: Three times a day (TID) | SUBCUTANEOUS | Status: DC

## 2017-02-15 MED ORDER — STROKE: EARLY STAGES OF RECOVERY BOOK
Freq: Once | Status: AC
  Administered 2017-02-15: 1

## 2017-02-15 MED ORDER — LORAZEPAM 2 MG/ML IJ SOLN
0.5000 mg | Freq: Once | INTRAMUSCULAR | Status: AC
  Administered 2017-02-15: 0.5 mg via INTRAVENOUS
  Filled 2017-02-15: qty 1

## 2017-02-15 MED ORDER — ENOXAPARIN SODIUM 40 MG/0.4ML ~~LOC~~ SOLN
40.0000 mg | SUBCUTANEOUS | Status: DC
  Administered 2017-02-15: 40 mg via SUBCUTANEOUS
  Filled 2017-02-15: qty 0.4

## 2017-02-15 MED ORDER — ATORVASTATIN CALCIUM 20 MG PO TABS: 40.0000 mg | ORAL_TABLET | Freq: Every day | ORAL | Status: DC

## 2017-02-15 MED ORDER — GADOBENATE DIMEGLUMINE 529 MG/ML IV SOLN
20.0000 mL | Freq: Once | INTRAVENOUS | Status: AC | PRN
  Administered 2017-02-15: 18 mL via INTRAVENOUS

## 2017-02-15 MED ORDER — ACETAMINOPHEN 160 MG/5ML PO SOLN: 650.0000 mg | ORAL | Status: DC | PRN

## 2017-02-15 MED ORDER — INSULIN DETEMIR 100 UNIT/ML ~~LOC~~ SOLN
15.0000 [IU] | Freq: Three times a day (TID) | SUBCUTANEOUS | Status: DC
  Administered 2017-02-15 – 2017-02-16 (×2): 15 [IU] via SUBCUTANEOUS
  Filled 2017-02-15 (×4): qty 0.15

## 2017-02-15 MED ORDER — GLIPIZIDE 10 MG PO TABS
10.0000 mg | ORAL_TABLET | Freq: Two times a day (BID) | ORAL | Status: DC
  Administered 2017-02-15 – 2017-02-16 (×2): 10 mg via ORAL
  Filled 2017-02-15 (×2): qty 1

## 2017-02-15 MED ORDER — LISINOPRIL 20 MG PO TABS
20.0000 mg | ORAL_TABLET | Freq: Every day | ORAL | Status: DC
  Administered 2017-02-16: 20 mg via ORAL
  Filled 2017-02-15: qty 1

## 2017-02-15 MED ORDER — CLOPIDOGREL BISULFATE 75 MG PO TABS
75.0000 mg | ORAL_TABLET | Freq: Every day | ORAL | Status: DC
  Administered 2017-02-15 – 2017-02-16 (×2): 75 mg via ORAL
  Filled 2017-02-15 (×2): qty 1

## 2017-02-15 MED ORDER — PREGABALIN 75 MG PO CAPS
225.0000 mg | ORAL_CAPSULE | Freq: Two times a day (BID) | ORAL | Status: DC
  Administered 2017-02-15 – 2017-02-16 (×2): 225 mg via ORAL
  Filled 2017-02-15 (×2): qty 3

## 2017-02-15 NOTE — Progress Notes (Signed)
[place] received a page for a code stroke Pt in ED Rm13. When [facility] arrived, Pt was gone to CT. Pt returned to Rm. Pt's son and his girlfriend were at bedside; Dc and nurse evaluating Pt. Pt was alert and talking. [place] talked to the Pt, encouraged Pt, and offered prayers for Pt's healing and for the family. [place] informed Pt that [facility] services were available as needed.    02/15/17 1900  Clinical Encounter Type  Visited With Patient;Patient and family together  Visit Type Initial;Spiritual support;Code;Trauma  Referral From Nurse  Consult/Referral To Chaplain  Spiritual Encounters  Spiritual Needs Prayer;Emotional;Other (Comment)

## 2017-02-15 NOTE — ED Notes (Signed)
Patient transported to MRI 

## 2017-02-15 NOTE — ED Notes (Signed)
Pt taken to CT room 2 via wheelchair with EDT.

## 2017-02-15 NOTE — ED Triage Notes (Signed)
Pt to ED via pov from home reports that 1 hr ago (1600) pt was baking a cake when she had a sudden onset of dizziness and numbness to rt leg and arm. Pt reports the back of her head feels numb also. Pt has equal grip strengths at this time. Speech is slurred at times. Denies pain.

## 2017-02-15 NOTE — ED Notes (Signed)
CBG 191 

## 2017-02-15 NOTE — ED Notes (Signed)
CODE STROKE CALLED TO 333 

## 2017-02-15 NOTE — ED Provider Notes (Signed)
[facility] Emergency Department Provider Note  ____________________________________________  Time seen: Approximately 5:37 PM  I have reviewed the triage vital signs and the nursing notes.   HISTORY  Chief Complaint Numbness   HPI Kimberly Duarte is a 76 y.o. 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 Patient reports that she was in her usual state of health until around 4 PM when she was baking a cake and developed sudden onset of dizziness and numbness into her right leg and right arm. Her son was checking on her and noticed that she was leaning over a table telling him that she was unable to walk due to the weakness. He took her to the car and tells me that she was able to walk with minimal assistance. He did not notice slurred speech, facial droop, or any difficulty finding words. Patient continues to endorse numbness and weakness of her right lower extremity and reports that the right upper extremity feels back to normal. No back pain, chest pain, abdominal pain, fever or chills, nausea or vomiting, dysuria hematuria, headache.  Past Medical History:  Diagnosis Date  . Depression   . Diabetes mellitus without complication ([place])   . Diabetic peripheral neuropathy ([place])   . GERD (gastroesophageal reflux disease)   . Hyperlipidemia   . Hypertension   . Obesity   . Uterine cancer [facility])     Patient Active Problem List   Diagnosis Date Noted  . CVA (cerebral vascular accident) ([place]) 11/27/2016    Past Surgical History:  Procedure Laterality Date  . ABDOMINAL HYSTERECTOMY    . COLONOSCOPY    . COLONOSCOPY WITH PROPOFOL N/A 09/04/2015   Procedure: COLONOSCOPY WITH PROPOFOL;  Surgeon: Manya Silvas, MD;  Location: [facility] ENDOSCOPY;  Service: Endoscopy;  Laterality: N/A;  .  ESOPHAGOGASTRODUODENOSCOPY (EGD) WITH PROPOFOL N/A 09/04/2015   Procedure: ESOPHAGOGASTRODUODENOSCOPY (EGD) WITH PROPOFOL;  Surgeon: Manya Silvas, MD;  Location: [facility] ENDOSCOPY;  Service: Endoscopy;  Laterality: N/A;  . UPPER GI ENDOSCOPY      Prior to Admission medications   Medication Sig Start Date End Date Taking? Authorizing Provider  aspirin EC 325 MG tablet Take 1 tablet (325 mg total) by mouth daily. 11/27/16  Yes Hillary Bow, MD  atorvastatin (LIPITOR) 40 MG tablet Take 1 tablet (40 mg total) by mouth daily at 6 PM. 11/27/16  Yes Sudini, Srikar, MD  citalopram (CELEXA) 20 MG tablet Take 20 mg by mouth daily.   Yes [provider]  Exenatide ER 2 MG/0.85ML AUIJ Inject 2 mg into the skin once a week.   Yes [provider]  glipiZIDE (GLUCOTROL) 10 MG tablet Take 10 mg by mouth 2 (two) times daily.    Yes [provider]  hydrochlorothiazide (MICROZIDE) 12.5 MG capsule Take 12.5 mg by mouth daily.   Yes [provider]  insulin NPH Human (HUMULIN N,NOVOLIN N) 100 UNIT/ML injection Inject 15-30 Units into the skin 3 (three) times daily. tk 30 units qam and 30 units qhs, and if sugar is high during the afternoon, tk an additional 15 units   Yes [provider]  lisinopril (PRINIVIL,ZESTRIL) 20 MG tablet Take 20 mg by mouth daily.   Yes [provider]  meloxicam (MOBIC) 15 MG tablet Take 15 mg by mouth daily.   Yes [provider]  metFORMIN (GLUCOPHAGE) 1000 MG tablet Take 1,000  mg by mouth 2 (two) times daily with a meal.   Yes [provider]  omeprazole (PRILOSEC) 20 MG capsule Take 20 mg by mouth daily as needed.    Yes [provider]  pregabalin (LYRICA) 225 MG capsule Take 225 mg by mouth 2 (two) times daily.   Yes [provider]  rOPINIRole (REQUIP) 0.5 MG tablet Take 0.5 mg by mouth every evening.   Yes [provider]  Vitamin D, Ergocalciferol, (DRISDOL) 50000 UNITS CAPS capsule  Take 50,000 Units by mouth every 7 (seven) days.   Yes [provider]    Allergies Actos [pioglitazone]; Byetta 10 mcg pen [exenatide]; and Victoza [liraglutide]  Family History  Problem Relation Age of Onset  . Breast cancer Sister 42  . Colon cancer Mother   . Heart disease Father     Social History Social History  Substance Use Topics  . Smoking status: Never Smoker  . Smokeless tobacco: Never Used  . Alcohol use No    Review of Systems  Constitutional: Negative for fever. Eyes: Negative for visual changes. ENT: Negative for sore throat. Neck: No neck pain  Cardiovascular: Negative for chest pain. Respiratory: Negative for shortness of breath. Gastrointestinal: Negative for abdominal pain, vomiting or diarrhea. Genitourinary: Negative for dysuria. Musculoskeletal: Negative for back pain. Skin: Negative for rash. Neurological: Negative for headaches. + Lightheadedness and right upper and lower extremity weakness and numbness Psych: No SI or HI  ____________________________________________   PHYSICAL EXAM:  VITAL SIGNS: ED Triage Vitals [02/15/17 1652]  Enc Vitals Group     BP (!) 145/87     Pulse Rate 74     Resp 18     Temp 98 F (36.7 C)     Temp Source Oral     SpO2 96 %     Weight 196 lb (88.9 kg)     Height 5\' 3"  (1.6 m)     Head Circumference      Peak Flow      Pain Score      Pain Loc      Pain Edu?      Excl. in [place]?     Constitutional: Alert and oriented. Well appearing and in no apparent distress. HEENT:      Head: Normocephalic and atraumatic.         Eyes: Conjunctivae are normal. Sclera is non-icteric.       Mouth/Throat: Mucous membranes are moist.       Neck: Supple with no signs of meningismus. Cardiovascular: Regular rate and rhythm. No murmurs, gallops, or rubs. 2+ symmetrical distal pulses are present in all extremities. No JVD. Respiratory: Normal respiratory effort. Lungs are clear to auscultation bilaterally. No  wheezes, crackles, or rhonchi.  Gastrointestinal: Soft, non tender, and non distended with positive bowel sounds. No rebound or guarding. Genitourinary: No CVA tenderness. Musculoskeletal: Nontender with normal range of motion in all extremities. No edema, cyanosis, or erythema of extremities. Neurologic: Normal speech and language. A & O x3, PERRL, no nystagmus, CN II-XII intact, motor testing reveals good tone and bulk throughout. There is no evidence of pronator drift, no upper extremity dysmetria, RLE dysmetric to heel-shin. Muscle strength is 5-/5 on RLE and 5/5 on other extremities. Deep tendon reflexes are 2+ throughout with downgoing toes. Sensory examination is intact. Gait deferred Skin: Skin is warm, dry and intact. No rash noted. Psychiatric: Mood and affect are normal. Speech and behavior are normal.  NIH Stroke Scale  Interval: Baseline  Time: 8:07 PM Person Administering Scale: [place] stroke scale items in the order listed. Record performance in each category after each subscale exam. Do not go back and change scores. Follow directions provided for each exam technique. Scores should reflect what the patient does, not what the clinician thinks the patient can do. The clinician should record answers while administering the exam and work quickly. Except where indicated, the patient should not be coached (i.e., repeated requests to patient to make a special effort).   1a  Level of consciousness: 0=alert; keenly responsive  1b. LOC questions:  0=Performs both tasks correctly  1c. LOC commands: 0=Performs both tasks correctly  2.  Best Gaze: 0=normal  3.  Visual: 0=No visual loss  4. Facial Palsy: 0=Normal symmetric movement  5a.  Motor left arm: 0=No drift, limb holds 90 (or 45) degrees for full 10 seconds  5b.  Motor right arm: 0=No drift, limb holds 90 (or 45) degrees for full 10 seconds  6a. motor left leg: 0=No drift, limb holds 90 (or 45) degrees for full  10 seconds  6b  Motor right leg:  1=Drift, limb holds 90 (or 45) degrees but drifts down before full 10 seconds: does not hit bed  7. Limb Ataxia: 1=Present in one limb  8.  Sensory: 0=Normal; no sensory loss  9. Best Language:  0=No aphasia, normal  10. Dysarthria: 0=Normal  11. Extinction and Inattention: 0=No abnormality   Total:   2     ____________________________________________   LABS (all labs ordered are listed, but only abnormal results are displayed)  Labs Reviewed  COMPREHENSIVE METABOLIC PANEL - Abnormal; Notable for the following:       Result Value   Glucose, Bld 211 (*)    All other components within normal limits  GLUCOSE, CAPILLARY - Abnormal; Notable for the following:    Glucose-Capillary 191 (*)    All other components within normal limits  PROTIME-INR  APTT  CBC  DIFFERENTIAL  TROPONIN I  URINALYSIS, COMPLETE (UACMP) WITH MICROSCOPIC  CBG MONITORING, ED  CBG MONITORING, ED   ____________________________________________  EKG  ED ECG REPORT I, Rudene Re, the attending physician, personally viewed and interpreted this ECG.  Normal sinus rhythm, rate of 78, normal intervals, normal axis, no ST elevations or depressions. ____________________________________________  RADIOLOGY  CT head: Negative  MRI/MRA head and neck: Acute Left thalamic stroke  ____________________________________________   PROCEDURES  Procedure(s) performed: None Procedures Critical Care performed:  None ____________________________________________   INITIAL IMPRESSION / ASSESSMENT AND PLAN / ED COURSE  76 y.o. 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 Last seen normal at 4 PM. Patient has no deficits from old stroke. NIH stroke scale on arrival 2. Head CT is negative. Patient evaluated by tele-neurology who  recommended MRI MRA of the head and neck. Patient was given a full dose of aspirin. BG 191    _________________________ 8:06 PM on 02/15/2017 -----------------------------------------  MRI showing acute left thalamic stroke. We'll admit to the hospitalist service.  Pertinent labs & imaging results that were available during my care of the patient were reviewed by me and considered in my medical decision making (see chart for details).    ____________________________________________   FINAL CLINICAL IMPRESSION(S) / ED DIAGNOSES  Final diagnoses:  Right sided weakness  Cerebrovascular accident (CVA), unspecified mechanism ([place])      NEW  MEDICATIONS STARTED DURING THIS VISIT:  New Prescriptions   No medications on file     Note:  This document was prepared using Dragon voice recognition software and may include unintentional dictation errors.    Rudene Re, MD 02/15/17 2007

## 2017-02-15 NOTE — H&P (Signed)
[facility] [place] NAME: Kimberly Duarte    MR#:  [id number]  DATE OF BIRTH:  [birth year]  DATE OF ADMISSION:  02/15/2017  PRIMARY CARE PHYSICIAN: Tracie Harrier, MD   REQUESTING/REFERRING PHYSICIAN: Kelli Hope MD  CHIEF COMPLAINT:   Chief Complaint  Patient presents with  . Numbness    HISTORY OF PRESENT ILLNESS: Kimberly Duarte  is a 76 y.o. female with a known history of  Depression, diabetes, diabetic neuropathy, GERD, essential hypertension, hyperlipidemia, obesity, uterine cancer and previous history of CVA who is presenting with right-sided weakness and numbness. Patient states that she was in her usual state of health and then around 4 PM she developed these symptoms. She reports that she continues to have weakness. She underwent MRI of the brain which showed new CVA. Patient's NIH score was 2. Telemetry neurologist suggested no TPA. Patient had a CVA in February and has been taking full dose aspirin since.   PAST MEDICAL HISTORY:   Past Medical History:  Diagnosis Date  . Depression   . Diabetes mellitus without complication ([place])   . Diabetic peripheral neuropathy ([place])   . GERD (gastroesophageal reflux disease)   . Hyperlipidemia   . Hypertension   . Obesity   . Uterine cancer ([place])     PAST SURGICAL HISTORY: Past Surgical History:  Procedure Laterality Date  . ABDOMINAL HYSTERECTOMY    . COLONOSCOPY    . COLONOSCOPY WITH PROPOFOL N/A 09/04/2015   Procedure: COLONOSCOPY WITH PROPOFOL;  Surgeon: Manya Silvas, MD;  Location: [facility] ENDOSCOPY;  Service: Endoscopy;  Laterality: N/A;  . ESOPHAGOGASTRODUODENOSCOPY (EGD) WITH PROPOFOL N/A 09/04/2015   Procedure: ESOPHAGOGASTRODUODENOSCOPY (EGD) WITH PROPOFOL;  Surgeon: Manya Silvas, MD;  Location: [facility] ENDOSCOPY;  Service: Endoscopy;  Laterality: N/A;  . UPPER GI ENDOSCOPY      SOCIAL HISTORY:  Social History  Substance Use Topics  . Smoking status: Never Smoker  .  Smokeless tobacco: Never Used  . Alcohol use No    FAMILY HISTORY:  Family History  Problem Relation Age of Onset  . Breast cancer Sister 60  . Colon cancer Mother   . Heart disease Father     DRUG ALLERGIES:  Allergies  Allergen Reactions  . Actos [Pioglitazone] Other (See Comments)    Reaction:  Stomach cramps   . Byetta 10 Mcg Pen [Exenatide] Nausea And Vomiting  . Victoza [Liraglutide] Other (See Comments)    Reaction:  Burning and stinging of feet      REVIEW OF SYSTEMS:   CONSTITUTIONAL: No fever, fatigue or weakness.  EYES: No blurred or double vision.  EARS, NOSE, AND THROAT: No tinnitus or ear pain.  RESPIRATORY: No cough, shortness of breath, wheezing or hemoptysis.  CARDIOVASCULAR: No chest pain, orthopnea, edema.  GASTROINTESTINAL: No nausea, vomiting, diarrhea or abdominal pain.  GENITOURINARY: No dysuria, hematuria.  ENDOCRINE: No polyuria, nocturia,  HEMATOLOGY: No anemia, easy bruising or bleeding SKIN: No rash or lesion. MUSCULOSKELETAL: No joint pain or arthritis.   NEUROLOGIC: Right-sided weakness, difficulty with ambulation PSYCHIATRY: No anxiety or depression.   MEDICATIONS AT HOME:  Prior to Admission medications   Medication Sig Start Date End Date Taking? Authorizing Provider  aspirin EC 325 MG tablet Take 1 tablet (325 mg total) by mouth daily. 11/27/16  Yes Hillary Bow, MD  atorvastatin (LIPITOR) 40 MG tablet Take 1 tablet (40 mg total) by mouth daily at 6 PM. 11/27/16  Yes Sudini, Alveta Heimlich, MD  citalopram (CELEXA) 20 MG  tablet Take 20 mg by mouth daily.   Yes [provider]  Exenatide ER 2 MG/0.85ML AUIJ Inject 2 mg into the skin once a week.   Yes [provider]  glipiZIDE (GLUCOTROL) 10 MG tablet Take 10 mg by mouth 2 (two) times daily.    Yes [provider]  hydrochlorothiazide (MICROZIDE) 12.5 MG capsule Take 12.5 mg by mouth daily.   Yes [provider]  insulin NPH Human (HUMULIN N,NOVOLIN N) 100  UNIT/ML injection Inject 15-30 Units into the skin 3 (three) times daily. tk 30 units qam and 30 units qhs, and if sugar is high during the afternoon, tk an additional 15 units   Yes [provider]  lisinopril (PRINIVIL,ZESTRIL) 20 MG tablet Take 20 mg by mouth daily.   Yes [provider]  meloxicam (MOBIC) 15 MG tablet Take 15 mg by mouth daily.   Yes [provider]  metFORMIN (GLUCOPHAGE) 1000 MG tablet Take 1,000 mg by mouth 2 (two) times daily with a meal.   Yes [provider]  omeprazole (PRILOSEC) 20 MG capsule Take 20 mg by mouth daily as needed.    Yes [provider]  pregabalin (LYRICA) 225 MG capsule Take 225 mg by mouth 2 (two) times daily.   Yes [provider]  rOPINIRole (REQUIP) 0.5 MG tablet Take 0.5 mg by mouth every evening.   Yes [provider]  Vitamin D, Ergocalciferol, (DRISDOL) 50000 UNITS CAPS capsule Take 50,000 Units by mouth every 7 (seven) days.   Yes [provider]      PHYSICAL EXAMINATION:   VITAL SIGNS: Blood pressure (!) 187/73, pulse 69, temperature 98.2 F (36.8 C), resp. rate 17, height 5\' 3"  (1.6 m), weight 196 lb (88.9 kg), SpO2 97 %.  GENERAL:  76 y.o.-year-old patient lying in the bed with no acute distress.  EYES: Pupils equal, round, reactive to light and accommodation. No scleral icterus. Extraocular muscles intact.  HEENT: Head atraumatic, normocephalic. Oropharynx and nasopharynx clear.  NECK:  Supple, no jugular venous distention. No thyroid enlargement, no tenderness.  LUNGS: Normal breath sounds bilaterally, no wheezing, rales,rhonchi or crepitation. No use of accessory muscles of respiration.  CARDIOVASCULAR: S1, S2 normal. No murmurs, rubs, or gallops.  ABDOMEN: Soft, nontender, nondistended. Bowel sounds present. No organomegaly or mass.  EXTREMITIES: No pedal edema, cyanosis, or clubbing.  NEUROLOGIC: Cranial nerves II through XII are intact.Right upper extremity  4/5. Right lower extremity 4 out of 5 Sensation intact. Gait not checked.  PSYCHIATRIC: The patient is alert and oriented x 3.  SKIN: No obvious rash, lesion, or ulcer.   LABORATORY PANEL:   CBC  Recent Labs Lab 02/15/17 1654  WBC 9.4  HGB 14.0  HCT 41.1  PLT 199  MCV 86.5  MCH 29.4  MCHC 34.0  RDW 13.3  LYMPHSABS 2.8  MONOABS 0.6  EOSABS 0.4  BASOSABS 0.0   ------------------------------------------------------------------------------------------------------------------  Chemistries   Recent Labs Lab 02/15/17 1654  NA 139  K 3.7  CL 104  CO2 23  GLUCOSE 211*  BUN 16  CREATININE 0.87  CALCIUM 9.4  AST 21  ALT 18  ALKPHOS 65  BILITOT 0.6   ------------------------------------------------------------------------------------------------------------------ estimated creatinine clearance is 60 mL/min (by C-G formula based on SCr of 0.87 mg/dL). ------------------------------------------------------------------------------------------------------------------ No results for input(s): TSH, T4TOTAL, T3FREE, THYROIDAB in the last 72 hours.  Invalid input(s): FREET3   Coagulation profile  Recent Labs Lab 02/15/17 1654  INR 0.93   ------------------------------------------------------------------------------------------------------------------- No results for  input(s): DDIMER in the last 72 hours. -------------------------------------------------------------------------------------------------------------------  Cardiac Enzymes  Recent Labs Lab 02/15/17 1654  TROPONINI <0.03   ------------------------------------------------------------------------------------------------------------------ Invalid input(s): POCBNP  ---------------------------------------------------------------------------------------------------------------  Urinalysis    Component Value Date/Time   COLORURINE YELLOW (A) 02/15/2017 1957   APPEARANCEUR CLEAR (A) 02/15/2017 1957    LABSPEC 1.029 02/15/2017 1957   PHURINE 5.0 02/15/2017 1957   GLUCOSEU >=500 (A) 02/15/2017 1957   HGBUR NEGATIVE 02/15/2017 1957   BILIRUBINUR NEGATIVE 02/15/2017 1957   KETONESUR NEGATIVE 02/15/2017 1957   PROTEINUR NEGATIVE 02/15/2017 1957   NITRITE NEGATIVE 02/15/2017 1957   LEUKOCYTESUR NEGATIVE 02/15/2017 1957     RADIOLOGY: Mr Angiogram Head Wo Contrast  Result Date: 02/15/2017 CLINICAL DATA:  Acute onset dizziness, RIGHT extremity weakness while baking a cake at 1400 hours. Head numbness, occasional slurred speech. History of stroke, hypertension, hyperlipidemia and cancer. EXAM: MRI HEAD WITHOUT CONTRAST MRA HEAD WITHOUT CONTRAST MRA NECK WITHOUT AND WITH CONTRAST TECHNIQUE: Multiplanar, multiecho pulse sequences of the brain and surrounding structures were obtained without intravenous contrast. Angiographic images of the Circle of Willis were obtained using MRA technique without intravenous contrast. Angiographic images of the neck were obtained using MRA technique without and with intravenous contrast. Carotid stenosis measurements (when applicable) are obtained utilizing NASCET criteria, using the distal internal carotid diameter as the denominator. CONTRAST:  91mL MULTIHANCE GADOBENATE DIMEGLUMINE 529 MG/ML IV SOLN COMPARISON:  MRI of the head November 27, 2016 and carotid ultrasound November 27, 2016 FINDINGS: MRI HEAD FINDINGS- mildly motion degraded examination. BRAIN: 9 mm ovoid faint reduced diffusion LEFT lateral thalamus parotid axial sequence, indistinct on the coronal DWI and no signal abnormality on remaining sequences. No susceptibility artifact to suggest hemorrhage. The ventricles and sulci are normal for patient's age, cavum velum interpositum. No suspicious parenchymal signal, masses or mass effect. Small area RIGHT frontal lobe encephalomalacia again noted. Scattered subcentimeter supratentorial white matter FLAIR T2 hyperintensities compatible with mild chronic small  vessel ischemic disease, less than expected for age. No abnormal extra-axial fluid collections. VASCULAR: Normal major intracranial vascular flow voids present at skull base. SKULL AND UPPER CERVICAL SPINE: No abnormal sellar expansion. No suspicious calvarial bone marrow signal. Craniocervical junction maintained. SINUSES/ORBITS: The mastoid air-cells and included paranasal sinuses are well-aerated. The included ocular globes and orbital contents are non-suspicious. OTHER: None. MRA HEAD FINDINGS- mildly motion degraded examination. ANTERIOR CIRCULATION: Normal flow related enhancement of the included cervical, petrous, cavernous and supraclinoid internal carotid arteries. Patent anterior communicating artery. Normal flow related enhancement of the anterior and middle cerebral arteries, including distal segments. Mild general luminal irregularity of the anterior and middle cerebral arteries. No large vessel occlusion, high-grade stenosis, or definite aneurysm. POSTERIOR CIRCULATION: RIGHT vertebral artery is dominant. Basilar artery is patent, with normal flow related enhancement of the main branch vessels. Mild luminal regularity vertebral artery's. Normal flow related enhancement of the posterior cerebral arteries, moderate tandem stenoses. Robust bilateral posterior communicating arteries present. No large vessel occlusion, high-grade stenosis, or definite aneurysm. ANATOMIC VARIANTS: None. Source images and MIP images were reviewed. MRA NECK FINDINGS- mildly motion degraded examination. ANTERIOR CIRCULATION: The common carotid arteries are widely patent bilaterally. The carotid bifurcations are patent bilaterally without hemodynamically significant stenosis by NASCET criteria. No flow limiting stenosis or luminal irregularity. POSTERIOR CIRCULATION: Bilateral vertebral arteries are patent to the vertebrobasilar junction. No flow limiting stenosis or luminal irregularity. Source images and MIP images were  reviewed. IMPRESSION: MRI HEAD: Mild motion degraded examination. Acute LEFT thalamus lacunar infarct versus motion artifact.  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 MRA HEAD: No emergent large vessel occlusion on this mildly motion degraded examination. Cerebral artery irregularity with moderate tandem stenoses PCA's can be seen with atherosclerosis or, due to motion artifact. MRA NECK: No hemodynamically significant stenosis or acute vascular process on this motion degraded examination. Electronically Signed   By: Elon Alas M.D.   On: 02/15/2017 19:41   Mr Jodene Nam Neck W Wo Contrast  Result Date: 02/15/2017 CLINICAL DATA:  Acute onset dizziness, RIGHT extremity weakness while baking a cake at 1400 hours. Head numbness, occasional slurred speech. History of stroke, hypertension, hyperlipidemia and cancer. EXAM: MRI HEAD WITHOUT CONTRAST MRA HEAD WITHOUT CONTRAST MRA NECK WITHOUT AND WITH CONTRAST TECHNIQUE: Multiplanar, multiecho pulse sequences of the brain and surrounding structures were obtained without intravenous contrast. Angiographic images of the Circle of Willis were obtained using MRA technique without intravenous contrast. Angiographic images of the neck were obtained using MRA technique without and with intravenous contrast. Carotid stenosis measurements (when applicable) are obtained utilizing NASCET criteria, using the distal internal carotid diameter as the denominator. CONTRAST:  42mL MULTIHANCE GADOBENATE DIMEGLUMINE 529 MG/ML IV SOLN COMPARISON:  MRI of the head November 27, 2016 and carotid ultrasound November 27, 2016 FINDINGS: MRI HEAD FINDINGS- mildly motion degraded examination. BRAIN: 9 mm ovoid faint reduced diffusion LEFT lateral thalamus parotid axial sequence, indistinct on the coronal DWI and no signal abnormality on remaining sequences. No susceptibility artifact to suggest hemorrhage. The ventricles and sulci are normal for  patient's age, cavum velum interpositum. No suspicious parenchymal signal, masses or mass effect. Small area RIGHT frontal lobe encephalomalacia again noted. Scattered subcentimeter supratentorial white matter FLAIR T2 hyperintensities compatible with mild chronic small vessel ischemic disease, less than expected for age. No abnormal extra-axial fluid collections. VASCULAR: Normal major intracranial vascular flow voids present at skull base. SKULL AND UPPER CERVICAL SPINE: No abnormal sellar expansion. No suspicious calvarial bone marrow signal. Craniocervical junction maintained. SINUSES/ORBITS: The mastoid air-cells and included paranasal sinuses are well-aerated. The included ocular globes and orbital contents are non-suspicious. OTHER: None. MRA HEAD FINDINGS- mildly motion degraded examination. ANTERIOR CIRCULATION: Normal flow related enhancement of the included cervical, petrous, cavernous and supraclinoid internal carotid arteries. Patent anterior communicating artery. Normal flow related enhancement of the anterior and middle cerebral arteries, including distal segments. Mild general luminal irregularity of the anterior and middle cerebral arteries. No large vessel occlusion, high-grade stenosis, or definite aneurysm. POSTERIOR CIRCULATION: RIGHT vertebral artery is dominant. Basilar artery is patent, with normal flow related enhancement of the main branch vessels. Mild luminal regularity vertebral artery's. Normal flow related enhancement of the posterior cerebral arteries, moderate tandem stenoses. Robust bilateral posterior communicating arteries present. No large vessel occlusion, high-grade stenosis, or definite aneurysm. ANATOMIC VARIANTS: None. Source images and MIP images were reviewed. MRA NECK FINDINGS- mildly motion degraded examination. ANTERIOR CIRCULATION: The common carotid arteries are widely patent bilaterally. The carotid bifurcations are patent bilaterally without hemodynamically  significant stenosis by NASCET criteria. No flow limiting stenosis or luminal irregularity. POSTERIOR CIRCULATION: Bilateral vertebral arteries are patent to the vertebrobasilar junction. No flow limiting stenosis or luminal irregularity. Source images and MIP images were reviewed. IMPRESSION: MRI HEAD: Mild motion degraded examination. Acute LEFT thalamus lacunar infarct versus motion artifact. 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 MRA HEAD: No emergent large vessel occlusion on this mildly motion degraded examination. Cerebral artery irregularity with moderate tandem stenoses PCA's can  be seen with atherosclerosis or, due to motion artifact. MRA NECK: No hemodynamically significant stenosis or acute vascular process on this motion degraded examination. Electronically Signed   By: Elon Alas M.D.   On: 02/15/2017 19:41   Mr Brain Wo Contrast  Result Date: 02/15/2017 CLINICAL DATA:  Acute onset dizziness, RIGHT extremity weakness while baking a cake at 1400 hours. Head numbness, occasional slurred speech. History of stroke, hypertension, hyperlipidemia and cancer. EXAM: MRI HEAD WITHOUT CONTRAST MRA HEAD WITHOUT CONTRAST MRA NECK WITHOUT AND WITH CONTRAST TECHNIQUE: Multiplanar, multiecho pulse sequences of the brain and surrounding structures were obtained without intravenous contrast. Angiographic images of the Circle of Willis were obtained using MRA technique without intravenous contrast. Angiographic images of the neck were obtained using MRA technique without and with intravenous contrast. Carotid stenosis measurements (when applicable) are obtained utilizing NASCET criteria, using the distal internal carotid diameter as the denominator. CONTRAST:  53mL MULTIHANCE GADOBENATE DIMEGLUMINE 529 MG/ML IV SOLN COMPARISON:  MRI of the head November 27, 2016 and carotid ultrasound November 27, 2016 FINDINGS: MRI HEAD FINDINGS- mildly motion degraded  examination. BRAIN: 9 mm ovoid faint reduced diffusion LEFT lateral thalamus parotid axial sequence, indistinct on the coronal DWI and no signal abnormality on remaining sequences. No susceptibility artifact to suggest hemorrhage. The ventricles and sulci are normal for patient's age, cavum velum interpositum. No suspicious parenchymal signal, masses or mass effect. Small area RIGHT frontal lobe encephalomalacia again noted. Scattered subcentimeter supratentorial white matter FLAIR T2 hyperintensities compatible with mild chronic small vessel ischemic disease, less than expected for age. No abnormal extra-axial fluid collections. VASCULAR: Normal major intracranial vascular flow voids present at skull base. SKULL AND UPPER CERVICAL SPINE: No abnormal sellar expansion. No suspicious calvarial bone marrow signal. Craniocervical junction maintained. SINUSES/ORBITS: The mastoid air-cells and included paranasal sinuses are well-aerated. The included ocular globes and orbital contents are non-suspicious. OTHER: None. MRA HEAD FINDINGS- mildly motion degraded examination. ANTERIOR CIRCULATION: Normal flow related enhancement of the included cervical, petrous, cavernous and supraclinoid internal carotid arteries. Patent anterior communicating artery. Normal flow related enhancement of the anterior and middle cerebral arteries, including distal segments. Mild general luminal irregularity of the anterior and middle cerebral arteries. No large vessel occlusion, high-grade stenosis, or definite aneurysm. POSTERIOR CIRCULATION: RIGHT vertebral artery is dominant. Basilar artery is patent, with normal flow related enhancement of the main branch vessels. Mild luminal regularity vertebral artery's. Normal flow related enhancement of the posterior cerebral arteries, moderate tandem stenoses. Robust bilateral posterior communicating arteries present. No large vessel occlusion, high-grade stenosis, or definite aneurysm. ANATOMIC  VARIANTS: None. Source images and MIP images were reviewed. MRA NECK FINDINGS- mildly motion degraded examination. ANTERIOR CIRCULATION: The common carotid arteries are widely patent bilaterally. The carotid bifurcations are patent bilaterally without hemodynamically significant stenosis by NASCET criteria. No flow limiting stenosis or luminal irregularity. POSTERIOR CIRCULATION: Bilateral vertebral arteries are patent to the vertebrobasilar junction. No flow limiting stenosis or luminal irregularity. Source images and MIP images were reviewed. IMPRESSION: MRI HEAD: Mild motion degraded examination. Acute LEFT thalamus lacunar infarct versus motion artifact. 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 MRA HEAD: No emergent large vessel occlusion on this mildly motion degraded examination. Cerebral artery irregularity with moderate tandem stenoses PCA's can be seen with atherosclerosis or, due to motion artifact. MRA NECK: No hemodynamically significant stenosis or acute vascular process on this motion degraded examination. Electronically Signed   By: Elon Alas M.D.   On: 02/15/2017  19:41   Ct Head Code Stroke W/o Cm  Result Date: 02/15/2017 CLINICAL DATA:  Code stroke. Bilateral leg numbness, RIGHT-sided weakness while baking a cake 1 hour ago. History of stroke, hypertension, hyperlipidemia and diabetes. EXAM: CT HEAD WITHOUT CONTRAST TECHNIQUE: Contiguous axial images were obtained from the base of the skull through the vertex without intravenous contrast. COMPARISON:  MRI of the head November 27, 2016 FINDINGS: BRAIN: No intraparenchymal hemorrhage, mass effect nor midline shift. The ventricles and sulci are normal for age, cavum velum interpositum. Patchy supratentorial white matter hypodensities within normal range for patient's age, though non-specific are most compatible with chronic small vessel ischemic disease. Patient's old small RIGHT frontal infarct  not apparent by CT. No acute large vascular territory infarcts. No abnormal extra-axial fluid collections. Basal cisterns are patent. VASCULAR: Mild calcific atherosclerosis of the carotid siphons. SKULL: No skull fracture. No significant scalp soft tissue swelling. SINUSES/ORBITS: The mastoid air-cells and included paranasal sinuses are well-aerated.The included ocular globes and orbital contents are non-suspicious. OTHER: None. ASPECTS [facility] Stroke Program Early CT Score) - Ganglionic level infarction (caudate, lentiform nuclei, internal capsule, insula, M1-M3 cortex): 7 - Supraganglionic infarction (M4-M6 cortex): 3 Total score (0-10 with 10 being normal): 10 IMPRESSION: 1. No acute intracranial process. Mild chronic small vessel ischemic disease. 2. ASPECTS is 10. Critical Value/emergent results were called by telephone at the time of interpretation on 02/15/2017 at 5:17 pm to Dr. Rudene Re , who verbally acknowledged these results. Electronically Signed   By: Elon Alas M.D.   On: 02/15/2017 17:23    EKG: Orders placed or performed during the hospital encounter of 02/15/17  . ED EKG  . ED EKG    IMPRESSION AND PLAN: Patient is a 76 year old with previous history of CVA presents with CVA again  1. Acute CVA Patient had a MRI of her brain as well as MRA Continue therapy with aspirin I will add Plavix Repeat echo of the heart I will ask neurology to see patient may need a TEE order a Holter monitor PT evaluation and therapy  2. Essential hypertension Continue therapy with lisinopril  3. Diabetes type 2 we'll treat with Glucotrol and NPH Place patient on sliding scale insulin  4. Essential hypertension continue lisinopril   All the records are reviewed and case discussed with ED provider. Management plans discussed with the patient, family and they are in agreement.  CODE STATUS:    Code Status Orders        Start     Ordered   02/15/17 2136  Full code   Continuous     02/15/17 2135    Code Status History    Date Active Date Inactive Code Status Order ID Comments User Context   11/27/2016  8:56 AM 11/28/2016  6:55 PM Full Code [id number]  Harrie Foreman, MD Inpatient       TOTAL TIME TAKING CARE OF THIS PATIENT:55 minutes.    Dustin Flock M.D on 02/15/2017 at 9:45 PM  Between 7am to 6pm - Pager - [phone]  After 6pm go to [email or web address] - password [email or web address] [facility]  Office  [phone]  CC: Primary care physician; Tracie Harrier, MD

## 2017-02-16 DIAGNOSIS — I639 Cerebral infarction, unspecified: Secondary | ICD-10-CM | POA: Diagnosis not present

## 2017-02-16 LAB — LIPID PANEL
Cholesterol: 144 mg/dL (ref 0–200)
HDL: 28 mg/dL — AB (ref >40)
LDL CALC: 73 mg/dL (ref 0–99)
Total CHOL/HDL Ratio: 5.1 RATIO
Triglycerides: 213 mg/dL — ABNORMAL HIGH (ref <150)
VLDL: 43 mg/dL — ABNORMAL HIGH (ref 0–40)

## 2017-02-16 LAB — GLUCOSE, CAPILLARY: GLUCOSE-CAPILLARY: 120 mg/dL — AB (ref 65–99)

## 2017-02-16 MED ORDER — ASPIRIN 81 MG PO CHEW: 81.0000 mg | CHEWABLE_TABLET | Freq: Every day | ORAL | 0 refills | Status: DC

## 2017-02-16 MED ORDER — CLOPIDOGREL BISULFATE 75 MG PO TABS: 75.0000 mg | ORAL_TABLET | Freq: Every day | ORAL | 0 refills | Status: AC

## 2017-02-16 NOTE — Progress Notes (Signed)
Discharge home to self accompanied by her dgt and other family member. Transported in transport chair to private vehicle.

## 2017-02-16 NOTE — Progress Notes (Signed)
NIH- 0. Reports she feels normal and at her baseline. No further studies indicated/needed per MD. Up ad lib in room. Oral and written AVS done with rx's called into her pharmacy. New med education done on plavix with stated understanding. Awaiting on her family for transport home.

## 2017-02-16 NOTE — Discharge Summary (Addendum)
[facility] [place] NAME: Kimberly Duarte    MR#:  [id number]  DATE OF BIRTH:  March 07, 1941  DATE OF ADMISSION:  02/15/2017 ADMITTING PHYSICIAN: Dustin Flock, MD  DATE OF DISCHARGE: 02/16/2017  PRIMARY CARE PHYSICIAN: Tracie Harrier, MD    ADMISSION DIAGNOSIS:  Right sided weakness [R53.1] Cerebrovascular accident (CVA), unspecified mechanism ([place]) [I63.9]  DISCHARGE DIAGNOSIS:  Active Problems:   CVA (cerebral vascular accident) ([place])   SECONDARY DIAGNOSIS:   Past Medical History:  Diagnosis Date  . Depression   . Diabetes mellitus without complication ([place])   . Diabetic peripheral neuropathy ([place])   . GERD (gastroesophageal reflux disease)   . Hyperlipidemia   . Hypertension   . Obesity   . Uterine cancer [facility])     HOSPITAL COURSE:   76 year old female with a history of diabetes and essential hypertension with previous history of CVA on imaging presents with right-sided weakness and numbness and found to have acute CVA.   1. Acute LEFT thalamus :lacunar infarct seen on MRI: Patient symptoms have resolved. She will be discharged on aspirin and Plavix. She will continue statin. She had recent echocardiogram and carotid Dopplers at the end of February. We will set her up to see Dr. Ubaldo Glassing this week to set up event recorder/Holter to evaluate for underlying   2. Diabetes: Patient will continue outpatient management with ADA diet.  3. Hyperlipidemia: Continue statin  4. Essential hypertension: Patient will continue HCTZ and lisinopril  6. Depression: Continue Celexa  7. GERD: Continue PPI  8. Restless leg syndrome continue Requip   DISCHARGE CONDITIONS AND DIET:   Stable for discharge on heart healthy diabetic diet  CONSULTS OBTAINED:  Treatment Team:  Alexis Goodell, MD  DRUG ALLERGIES:   Allergies  Allergen Reactions  . Actos [Pioglitazone] Other (See Comments)    Reaction:  Stomach cramps   . [place] 10  Mcg Pen [Exenatide] Nausea And Vomiting  . Victoza [Liraglutide] Other (See Comments)    Reaction:  Burning and stinging of feet      DISCHARGE MEDICATIONS:   Current Discharge Medication List    START taking these medications   Details  aspirin 81 MG chewable tablet Chew 1 tablet (81 mg total) by mouth daily. Qty: 30 tablet, Refills: 0    clopidogrel (PLAVIX) 75 MG tablet Take 1 tablet (75 mg total) by mouth daily. Qty: 30 tablet, Refills: 0      CONTINUE these medications which have NOT CHANGED   Details  atorvastatin (LIPITOR) 40 MG tablet Take 1 tablet (40 mg total) by mouth daily at 6 PM. Qty: 30 tablet, Refills: 0    citalopram (CELEXA) 20 MG tablet Take 20 mg by mouth daily.    Exenatide ER 2 MG/0.85ML AUIJ Inject 2 mg into the skin once a week.    glipiZIDE (GLUCOTROL) 10 MG tablet Take 10 mg by mouth 2 (two) times daily.     hydrochlorothiazide (MICROZIDE) 12.5 MG capsule Take 12.5 mg by mouth daily.    insulin NPH Human (HUMULIN N,NOVOLIN N) 100 UNIT/ML injection Inject 15-30 Units into the skin 3 (three) times daily. tk 30 units qam and 30 units qhs, and if sugar is high during the afternoon, tk an additional 15 units    lisinopril (PRINIVIL,ZESTRIL) 20 MG tablet Take 20 mg by mouth daily.    meloxicam (MOBIC) 15 MG tablet Take 15 mg by mouth daily.    metFORMIN (GLUCOPHAGE) 1000 MG tablet Take 1,000 mg  by mouth 2 (two) times daily with a meal.    omeprazole (PRILOSEC) 20 MG capsule Take 20 mg by mouth daily as needed.     pregabalin (LYRICA) 225 MG capsule Take 225 mg by mouth 2 (two) times daily.    rOPINIRole (REQUIP) 0.5 MG tablet Take 0.5 mg by mouth every evening.    Vitamin D, Ergocalciferol, (DRISDOL) 50000 UNITS CAPS capsule Take 50,000 Units by mouth every 7 (seven) days.      STOP taking these medications     aspirin EC 325 MG tablet           Today   CHIEF COMPLAINT:   All the patient's symptoms have resolved. She is ambulating  without any difficulties. She has no weakness on the right arm. She has no vision problems or speech issues.   VITAL SIGNS:  Blood pressure 134/68, pulse 70, temperature 97.6 F (36.4 C), temperature source Oral, resp. rate 16, height 5\' 3"  (1.6 m), weight 88.9 kg (196 lb), SpO2 97 %.   REVIEW OF SYSTEMS:  Review of Systems  Constitutional: Negative.  Negative for chills, fever and malaise/fatigue.  HENT: Negative.  Negative for ear discharge, ear pain, hearing loss, nosebleeds and sore throat.   Eyes: Negative.  Negative for blurred vision and pain.  Respiratory: Negative.  Negative for cough, hemoptysis, shortness of breath and wheezing.   Cardiovascular: Negative.  Negative for chest pain, palpitations and leg swelling.  Gastrointestinal: Negative.  Negative for abdominal pain, blood in stool, diarrhea, nausea and vomiting.  Genitourinary: Negative.  Negative for dysuria.  Musculoskeletal: Negative.  Negative for back pain.  Skin: Negative.   Neurological: Negative for dizziness, tremors, speech change, focal weakness, seizures and headaches.  Endo/Heme/Allergies: Negative.  Does not bruise/bleed easily.  Psychiatric/Behavioral: Negative.  Negative for depression, hallucinations and suicidal ideas.     PHYSICAL EXAMINATION:  GENERAL:  76 y.o.-year-old patient lying in the bed with no acute distress.  NECK:  Supple, no jugular venous distention. No thyroid enlargement, no tenderness.  LUNGS: Normal breath sounds bilaterally, no wheezing, rales,rhonchi  No use of accessory muscles of respiration.  CARDIOVASCULAR: S1, S2 normal. No murmurs, rubs, or gallops.  ABDOMEN: Soft, non-tender, non-distended. Bowel sounds present. No organomegaly or mass.  EXTREMITIES: No pedal edema, cyanosis, or clubbing.  PSYCHIATRIC: The patient is alert and oriented x 3.  SKIN: No obvious rash, lesion, or ulcer.   DATA REVIEW:   CBC  Recent Labs Lab 02/15/17 1654  WBC 9.4  HGB 14.0  HCT 41.1   PLT 199    Chemistries   Recent Labs Lab 02/15/17 1654  NA 139  K 3.7  CL 104  CO2 23  GLUCOSE 211*  BUN 16  CREATININE 0.87  CALCIUM 9.4  AST 21  ALT 18  ALKPHOS 65  BILITOT 0.6    Cardiac Enzymes  Recent Labs Lab 02/15/17 1654  [place] <0.03    Microbiology Results  @[email or web address] @  RADIOLOGY:  Mr Angiogram Head Wo Contrast  Result Date: 02/15/2017 CLINICAL DATA:  Acute onset dizziness, RIGHT extremity weakness while baking a cake at 1400 hours. Head numbness, occasional slurred speech. History of stroke, hypertension, hyperlipidemia and cancer. EXAM: MRI HEAD WITHOUT CONTRAST MRA HEAD WITHOUT CONTRAST MRA NECK WITHOUT AND WITH CONTRAST TECHNIQUE: Multiplanar, multiecho pulse sequences of the brain and surrounding structures were obtained without intravenous contrast. Angiographic images of the Circle of Willis were obtained using MRA technique without intravenous contrast. Angiographic images of the neck were obtained using  MRA technique without and with intravenous contrast. Carotid stenosis measurements (when applicable) are obtained utilizing NASCET criteria, using the distal internal carotid diameter as the denominator. CONTRAST:  67mL MULTIHANCE GADOBENATE DIMEGLUMINE 529 MG/ML IV SOLN COMPARISON:  MRI of the head November 27, 2016 and carotid ultrasound November 27, 2016 FINDINGS: MRI HEAD FINDINGS- mildly motion degraded examination. BRAIN: 9 mm ovoid faint reduced diffusion LEFT lateral thalamus parotid axial sequence, indistinct on the coronal DWI and no signal abnormality on remaining sequences. No susceptibility artifact to suggest hemorrhage. The ventricles and sulci are normal for patient's age, cavum velum interpositum. No suspicious parenchymal signal, masses or mass effect. Small area RIGHT frontal lobe encephalomalacia again noted. Scattered subcentimeter supratentorial white matter FLAIR T2 hyperintensities compatible with mild chronic small vessel ischemic  disease, less than expected for age. No abnormal extra-axial fluid collections. VASCULAR: Normal major intracranial vascular flow voids present at skull base. SKULL AND UPPER CERVICAL SPINE: No abnormal sellar expansion. No suspicious calvarial bone marrow signal. Craniocervical junction maintained. SINUSES/ORBITS: The mastoid air-cells and included paranasal sinuses are well-aerated. The included ocular globes and orbital contents are non-suspicious. OTHER: None. MRA HEAD FINDINGS- mildly motion degraded examination. ANTERIOR CIRCULATION: Normal flow related enhancement of the included cervical, petrous, cavernous and supraclinoid internal carotid arteries. Patent anterior communicating artery. Normal flow related enhancement of the anterior and middle cerebral arteries, including distal segments. Mild general luminal irregularity of the anterior and middle cerebral arteries. No large vessel occlusion, high-grade stenosis, or definite aneurysm. POSTERIOR CIRCULATION: RIGHT vertebral artery is dominant. Basilar artery is patent, with normal flow related enhancement of the main branch vessels. Mild luminal regularity vertebral artery's. Normal flow related enhancement of the posterior cerebral arteries, moderate tandem stenoses. Robust bilateral posterior communicating arteries present. No large vessel occlusion, high-grade stenosis, or definite aneurysm. ANATOMIC VARIANTS: None. Source images and MIP images were reviewed. MRA NECK FINDINGS- mildly motion degraded examination. ANTERIOR CIRCULATION: The common carotid arteries are widely patent bilaterally. The carotid bifurcations are patent bilaterally without hemodynamically significant stenosis by NASCET criteria. No flow limiting stenosis or luminal irregularity. POSTERIOR CIRCULATION: Bilateral vertebral arteries are patent to the vertebrobasilar junction. No flow limiting stenosis or luminal irregularity. Source images and MIP images were reviewed. IMPRESSION:  MRI HEAD: Mild motion degraded examination. Acute LEFT thalamus lacunar infarct versus motion artifact. 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 MRA HEAD: No emergent large vessel occlusion on this mildly motion degraded examination. Cerebral artery irregularity with moderate tandem stenoses PCA's can be seen with atherosclerosis or, due to motion artifact. MRA NECK: No hemodynamically significant stenosis or acute vascular process on this motion degraded examination. Electronically Signed   By: Elon Alas M.D.   On: 02/15/2017 19:41   Mr Jodene Nam Neck W Wo Contrast  Result Date: 02/15/2017 CLINICAL DATA:  Acute onset dizziness, RIGHT extremity weakness while baking a cake at 1400 hours. Head numbness, occasional slurred speech. History of stroke, hypertension, hyperlipidemia and cancer. EXAM: MRI HEAD WITHOUT CONTRAST MRA HEAD WITHOUT CONTRAST MRA NECK WITHOUT AND WITH CONTRAST TECHNIQUE: Multiplanar, multiecho pulse sequences of the brain and surrounding structures were obtained without intravenous contrast. Angiographic images of the Circle of Willis were obtained using MRA technique without intravenous contrast. Angiographic images of the neck were obtained using MRA technique without and with intravenous contrast. Carotid stenosis measurements (when applicable) are obtained utilizing NASCET criteria, using the distal internal carotid diameter as the denominator. CONTRAST:  46mL MULTIHANCE GADOBENATE DIMEGLUMINE 529 MG/ML IV  SOLN COMPARISON:  MRI of the head November 27, 2016 and carotid ultrasound November 27, 2016 FINDINGS: MRI HEAD FINDINGS- mildly motion degraded examination. BRAIN: 9 mm ovoid faint reduced diffusion LEFT lateral thalamus parotid axial sequence, indistinct on the coronal DWI and no signal abnormality on remaining sequences. No susceptibility artifact to suggest hemorrhage. The ventricles and sulci are normal for patient's age, cavum velum  interpositum. No suspicious parenchymal signal, masses or mass effect. Small area RIGHT frontal lobe encephalomalacia again noted. Scattered subcentimeter supratentorial white matter FLAIR T2 hyperintensities compatible with mild chronic small vessel ischemic disease, less than expected for age. No abnormal extra-axial fluid collections. VASCULAR: Normal major intracranial vascular flow voids present at skull base. SKULL AND UPPER CERVICAL SPINE: No abnormal sellar expansion. No suspicious calvarial bone marrow signal. Craniocervical junction maintained. SINUSES/ORBITS: The mastoid air-cells and included paranasal sinuses are well-aerated. The included ocular globes and orbital contents are non-suspicious. OTHER: None. MRA HEAD FINDINGS- mildly motion degraded examination. ANTERIOR CIRCULATION: Normal flow related enhancement of the included cervical, petrous, cavernous and supraclinoid internal carotid arteries. Patent anterior communicating artery. Normal flow related enhancement of the anterior and middle cerebral arteries, including distal segments. Mild general luminal irregularity of the anterior and middle cerebral arteries. No large vessel occlusion, high-grade stenosis, or definite aneurysm. POSTERIOR CIRCULATION: RIGHT vertebral artery is dominant. Basilar artery is patent, with normal flow related enhancement of the main branch vessels. Mild luminal regularity vertebral artery's. Normal flow related enhancement of the posterior cerebral arteries, moderate tandem stenoses. Robust bilateral posterior communicating arteries present. No large vessel occlusion, high-grade stenosis, or definite aneurysm. ANATOMIC VARIANTS: None. Source images and MIP images were reviewed. MRA NECK FINDINGS- mildly motion degraded examination. ANTERIOR CIRCULATION: The common carotid arteries are widely patent bilaterally. The carotid bifurcations are patent bilaterally without hemodynamically significant stenosis by NASCET  criteria. No flow limiting stenosis or luminal irregularity. POSTERIOR CIRCULATION: Bilateral vertebral arteries are patent to the vertebrobasilar junction. No flow limiting stenosis or luminal irregularity. Source images and MIP images were reviewed. IMPRESSION: MRI HEAD: Mild motion degraded examination. Acute LEFT thalamus lacunar infarct versus motion artifact. 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 MRA HEAD: No emergent large vessel occlusion on this mildly motion degraded examination. Cerebral artery irregularity with moderate tandem stenoses PCA's can be seen with atherosclerosis or, due to motion artifact. MRA NECK: No hemodynamically significant stenosis or acute vascular process on this motion degraded examination. Electronically Signed   By: Elon Alas M.D.   On: 02/15/2017 19:41   Mr Brain Wo Contrast  Result Date: 02/15/2017 CLINICAL DATA:  Acute onset dizziness, RIGHT extremity weakness while baking a cake at 1400 hours. Head numbness, occasional slurred speech. History of stroke, hypertension, hyperlipidemia and cancer. EXAM: MRI HEAD WITHOUT CONTRAST MRA HEAD WITHOUT CONTRAST MRA NECK WITHOUT AND WITH CONTRAST TECHNIQUE: Multiplanar, multiecho pulse sequences of the brain and surrounding structures were obtained without intravenous contrast. Angiographic images of the Circle of Willis were obtained using MRA technique without intravenous contrast. Angiographic images of the neck were obtained using MRA technique without and with intravenous contrast. Carotid stenosis measurements (when applicable) are obtained utilizing NASCET criteria, using the distal internal carotid diameter as the denominator. CONTRAST:  66mL MULTIHANCE GADOBENATE DIMEGLUMINE 529 MG/ML IV SOLN COMPARISON:  MRI of the head November 27, 2016 and carotid ultrasound November 27, 2016 FINDINGS: MRI HEAD FINDINGS- mildly motion degraded examination. BRAIN: 9 mm ovoid faint  reduced diffusion LEFT lateral thalamus parotid axial  sequence, indistinct on the coronal DWI and no signal abnormality on remaining sequences. No susceptibility artifact to suggest hemorrhage. The ventricles and sulci are normal for patient's age, cavum velum interpositum. No suspicious parenchymal signal, masses or mass effect. Small area RIGHT frontal lobe encephalomalacia again noted. Scattered subcentimeter supratentorial white matter FLAIR T2 hyperintensities compatible with mild chronic small vessel ischemic disease, less than expected for age. No abnormal extra-axial fluid collections. VASCULAR: Normal major intracranial vascular flow voids present at skull base. SKULL AND UPPER CERVICAL SPINE: No abnormal sellar expansion. No suspicious calvarial bone marrow signal. Craniocervical junction maintained. SINUSES/ORBITS: The mastoid air-cells and included paranasal sinuses are well-aerated. The included ocular globes and orbital contents are non-suspicious. OTHER: None. MRA HEAD FINDINGS- mildly motion degraded examination. ANTERIOR CIRCULATION: Normal flow related enhancement of the included cervical, petrous, cavernous and supraclinoid internal carotid arteries. Patent anterior communicating artery. Normal flow related enhancement of the anterior and middle cerebral arteries, including distal segments. Mild general luminal irregularity of the anterior and middle cerebral arteries. No large vessel occlusion, high-grade stenosis, or definite aneurysm. POSTERIOR CIRCULATION: RIGHT vertebral artery is dominant. Basilar artery is patent, with normal flow related enhancement of the main branch vessels. Mild luminal regularity vertebral artery's. Normal flow related enhancement of the posterior cerebral arteries, moderate tandem stenoses. Robust bilateral posterior communicating arteries present. No large vessel occlusion, high-grade stenosis, or definite aneurysm. ANATOMIC VARIANTS: None. Source images and MIP  images were reviewed. MRA NECK FINDINGS- mildly motion degraded examination. ANTERIOR CIRCULATION: The common carotid arteries are widely patent bilaterally. The carotid bifurcations are patent bilaterally without hemodynamically significant stenosis by NASCET criteria. No flow limiting stenosis or luminal irregularity. POSTERIOR CIRCULATION: Bilateral vertebral arteries are patent to the vertebrobasilar junction. No flow limiting stenosis or luminal irregularity. Source images and MIP images were reviewed. IMPRESSION: MRI HEAD: Mild motion degraded examination. Acute LEFT thalamus lacunar infarct versus motion artifact. 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 MRA HEAD: No emergent large vessel occlusion on this mildly motion degraded examination. Cerebral artery irregularity with moderate tandem stenoses PCA's can be seen with atherosclerosis or, due to motion artifact. MRA NECK: No hemodynamically significant stenosis or acute vascular process on this motion degraded examination. Electronically Signed   By: Elon Alas M.D.   On: 02/15/2017 19:41   Ct Head Code Stroke W/o Cm  Result Date: 02/15/2017 CLINICAL DATA:  Code stroke. Bilateral leg numbness, RIGHT-sided weakness while baking a cake 1 hour ago. History of stroke, hypertension, hyperlipidemia and diabetes. EXAM: CT HEAD WITHOUT CONTRAST TECHNIQUE: Contiguous axial images were obtained from the base of the skull through the vertex without intravenous contrast. COMPARISON:  MRI of the head November 27, 2016 FINDINGS: BRAIN: No intraparenchymal hemorrhage, mass effect nor midline shift. The ventricles and sulci are normal for age, cavum velum interpositum. Patchy supratentorial white matter hypodensities within normal range for patient's age, though non-specific are most compatible with chronic small vessel ischemic disease. Patient's old small RIGHT frontal infarct not apparent by CT. No acute large  vascular territory infarcts. No abnormal extra-axial fluid collections. Basal cisterns are patent. VASCULAR: Mild calcific atherosclerosis of the carotid siphons. SKULL: No skull fracture. No significant scalp soft tissue swelling. SINUSES/ORBITS: The mastoid air-cells and included paranasal sinuses are well-aerated.The included ocular globes and orbital contents are non-suspicious. OTHER: None. ASPECTS [facility] Stroke Program Early CT Score) - Ganglionic level infarction (caudate, lentiform nuclei, internal capsule, insula, M1-M3 cortex): 7 - Supraganglionic infarction (M4-M6 cortex): 3  Total score (0-10 with 10 being normal): 10 IMPRESSION: 1. No acute intracranial process. Mild chronic small vessel ischemic disease. 2. ASPECTS is 10. Critical Value/emergent results were called by telephone at the time of interpretation on 02/15/2017 at 5:17 pm to Dr. Rudene Re , who verbally acknowledged these results. Electronically Signed   By: Elon Alas M.D.   On: 02/15/2017 17:23      Current Discharge Medication List    START taking these medications   Details  aspirin 81 MG chewable tablet Chew 1 tablet (81 mg total) by mouth daily. Qty: 30 tablet, Refills: 0    clopidogrel (PLAVIX) 75 MG tablet Take 1 tablet (75 mg total) by mouth daily. Qty: 30 tablet, Refills: 0      CONTINUE these medications which have NOT CHANGED   Details  atorvastatin (LIPITOR) 40 MG tablet Take 1 tablet (40 mg total) by mouth daily at 6 PM. Qty: 30 tablet, Refills: 0    citalopram (CELEXA) 20 MG tablet Take 20 mg by mouth daily.    Exenatide ER 2 MG/0.85ML AUIJ Inject 2 mg into the skin once a week.    glipiZIDE (GLUCOTROL) 10 MG tablet Take 10 mg by mouth 2 (two) times daily.     hydrochlorothiazide (MICROZIDE) 12.5 MG capsule Take 12.5 mg by mouth daily.    insulin NPH Human (HUMULIN N,NOVOLIN N) 100 UNIT/ML injection Inject 15-30 Units into the skin 3 (three) times daily. tk 30 units qam and 30 units  qhs, and if sugar is high during the afternoon, tk an additional 15 units    lisinopril (PRINIVIL,ZESTRIL) 20 MG tablet Take 20 mg by mouth daily.    meloxicam (MOBIC) 15 MG tablet Take 15 mg by mouth daily.    metFORMIN (GLUCOPHAGE) 1000 MG tablet Take 1,000 mg by mouth 2 (two) times daily with a meal.    omeprazole (PRILOSEC) 20 MG capsule Take 20 mg by mouth daily as needed.     pregabalin (LYRICA) 225 MG capsule Take 225 mg by mouth 2 (two) times daily.    rOPINIRole (REQUIP) 0.5 MG tablet Take 0.5 mg by mouth every evening.    Vitamin D, Ergocalciferol, (DRISDOL) 50000 UNITS CAPS capsule Take 50,000 Units by mouth every 7 (seven) days.      STOP taking these medications     aspirin EC 325 MG tablet             Management plans discussed with the patient and she is in agreement. Stable for discharge home  Patient should follow up with PCP  CODE STATUS:     Code Status Orders        Start     Ordered   02/15/17 2136  Full code  Continuous     02/15/17 2135    Code Status History    Date Active Date Inactive Code Status Order ID Comments User Context   11/27/2016  8:56 AM 11/28/2016  6:55 PM Full Code [id number]  Harrie Foreman, MD Inpatient      TOTAL TIME TAKING CARE OF THIS PATIENT: 37 minutes.    Note: This dictation was prepared with Dragon dictation along with smaller phrase technology. Any transcriptional errors that result from this process are unintentional.  Javarious Elsayed M.D on 02/16/2017 at 7:59 AM  Between 7am to 6pm - Pager - [phone] After 6pm go to [email or web address] - password [email or web address]  Office  [phone]  CC: Primary care physician; Tracie Harrier, MD

## 2017-02-17 ENCOUNTER — Observation Stay: Payer: Medicare Other

## 2017-02-17 ENCOUNTER — Encounter: Payer: Self-pay | Admitting: Emergency Medicine

## 2017-02-17 ENCOUNTER — Inpatient Hospital Stay
Admission: EM | Admit: 2017-02-17 | Discharge: 2017-02-19 | DRG: 065 | Disposition: A | Discharge status: Home / Self Care | Payer: Medicare Other | Attending: Internal Medicine | Admitting: Internal Medicine

## 2017-02-17 ENCOUNTER — Emergency Department: Payer: Medicare Other

## 2017-02-17 DIAGNOSIS — I639 Cerebral infarction, unspecified: Principal | ICD-10-CM | POA: Diagnosis present

## 2017-02-17 DIAGNOSIS — Z7902 Long term (current) use of antithrombotics/antiplatelets: Secondary | ICD-10-CM | POA: Diagnosis not present

## 2017-02-17 DIAGNOSIS — E1142 Type 2 diabetes mellitus with diabetic polyneuropathy: Secondary | ICD-10-CM | POA: Diagnosis present

## 2017-02-17 DIAGNOSIS — R29704 NIHSS score 4: Secondary | ICD-10-CM | POA: Diagnosis not present

## 2017-02-17 DIAGNOSIS — E669 Obesity, unspecified: Secondary | ICD-10-CM | POA: Diagnosis not present

## 2017-02-17 DIAGNOSIS — Z888 Allergy status to other drugs, medicaments and biological substances status: Secondary | ICD-10-CM | POA: Diagnosis not present

## 2017-02-17 DIAGNOSIS — R471 Dysarthria and anarthria: Secondary | ICD-10-CM | POA: Diagnosis present

## 2017-02-17 DIAGNOSIS — G8191 Hemiplegia, unspecified affecting right dominant side: Secondary | ICD-10-CM | POA: Diagnosis not present

## 2017-02-17 DIAGNOSIS — K219 Gastro-esophageal reflux disease without esophagitis: Secondary | ICD-10-CM | POA: Diagnosis not present

## 2017-02-17 DIAGNOSIS — Z7982 Long term (current) use of aspirin: Secondary | ICD-10-CM | POA: Diagnosis not present

## 2017-02-17 DIAGNOSIS — Z794 Long term (current) use of insulin: Secondary | ICD-10-CM | POA: Diagnosis not present

## 2017-02-17 DIAGNOSIS — F329 Major depressive disorder, single episode, unspecified: Secondary | ICD-10-CM | POA: Diagnosis present

## 2017-02-17 DIAGNOSIS — R531 Weakness: Secondary | ICD-10-CM | POA: Diagnosis not present

## 2017-02-17 DIAGNOSIS — Z79899 Other long term (current) drug therapy: Secondary | ICD-10-CM

## 2017-02-17 DIAGNOSIS — I10 Essential (primary) hypertension: Secondary | ICD-10-CM | POA: Diagnosis present

## 2017-02-17 DIAGNOSIS — E785 Hyperlipidemia, unspecified: Secondary | ICD-10-CM | POA: Diagnosis not present

## 2017-02-17 DIAGNOSIS — Z6834 Body mass index (BMI) 34.0-34.9, adult: Secondary | ICD-10-CM

## 2017-02-17 HISTORY — DX: Cerebral infarction, unspecified: I63.9

## 2017-02-17 LAB — CBC
HEMATOCRIT: 43 % (ref 35.0–47.0)
Hemoglobin: 14.6 g/dL (ref 12.0–16.0)
MCH: 29.3 pg (ref 26.0–34.0)
MCHC: 33.9 g/dL (ref 32.0–36.0)
MCV: 86.3 fL (ref 80.0–100.0)
Platelets: 207 10*3/uL (ref 150–440)
RBC: 4.98 MIL/uL (ref 3.80–5.20)
RDW: 13.1 % (ref 11.5–14.5)
WBC: 8.7 10*3/uL (ref 3.6–11.0)

## 2017-02-17 LAB — COMPREHENSIVE METABOLIC PANEL
ALT: 18 U/L (ref 14–54)
ANION GAP: 11 (ref 5–15)
AST: 26 U/L (ref 15–41)
Albumin: 4.2 g/dL (ref 3.5–5.0)
Alkaline Phosphatase: 63 U/L (ref 38–126)
BILIRUBIN TOTAL: 0.7 mg/dL (ref 0.3–1.2)
BUN: 12 mg/dL (ref 6–20)
CHLORIDE: 105 mmol/L (ref 101–111)
CO2: 24 mmol/L (ref 22–32)
Calcium: 9.5 mg/dL (ref 8.9–10.3)
Creatinine, Ser: 0.86 mg/dL (ref 0.44–1.00)
Glucose, Bld: 172 mg/dL — ABNORMAL HIGH (ref 65–99)
POTASSIUM: 3.8 mmol/L (ref 3.5–5.1)
Sodium: 140 mmol/L (ref 135–145)
TOTAL PROTEIN: 7.5 g/dL (ref 6.5–8.1)

## 2017-02-17 LAB — HEMOGLOBIN A1C
Hgb A1c MFr Bld: 9.4 % — ABNORMAL HIGH (ref 4.8–5.6)
Mean Plasma Glucose: 223 mg/dL

## 2017-02-17 LAB — GLUCOSE, CAPILLARY
GLUCOSE-CAPILLARY: 166 mg/dL — AB (ref 65–99)
GLUCOSE-CAPILLARY: 118 mg/dL — AB (ref 65–99)
Glucose-Capillary: 102 mg/dL — ABNORMAL HIGH (ref 65–99)
Glucose-Capillary: 167 mg/dL — ABNORMAL HIGH (ref 65–99)

## 2017-02-17 LAB — DIFFERENTIAL
BASOS ABS: 0.1 10*3/uL (ref 0–0.1)
BASOS PCT: 1 %
EOS ABS: 0.3 10*3/uL (ref 0–0.7)
EOS PCT: 4 %
LYMPHS ABS: 2.5 10*3/uL (ref 1.0–3.6)
Lymphocytes Relative: 29 %
MONOS PCT: 9 %
Monocytes Absolute: 0.8 10*3/uL (ref 0.2–0.9)
Neutro Abs: 5 10*3/uL (ref 1.4–6.5)
Neutrophils Relative %: 57 %

## 2017-02-17 LAB — PROTIME-INR
INR: 0.94
Prothrombin Time: 12.6 seconds (ref 11.4–15.2)

## 2017-02-17 LAB — APTT: APTT: 32 s (ref 24–36)

## 2017-02-17 LAB — TROPONIN I

## 2017-02-17 MED ORDER — PANTOPRAZOLE SODIUM 40 MG PO TBEC
40.0000 mg | DELAYED_RELEASE_TABLET | Freq: Every day | ORAL | Status: DC
  Administered 2017-02-17 – 2017-02-18 (×2): 40 mg via ORAL
  Filled 2017-02-17 (×2): qty 1

## 2017-02-17 MED ORDER — GLIPIZIDE 10 MG PO TABS: 10.0000 mg | ORAL_TABLET | Freq: Two times a day (BID) | ORAL | Status: DC

## 2017-02-17 MED ORDER — CLOPIDOGREL BISULFATE 75 MG PO TABS
75.0000 mg | ORAL_TABLET | Freq: Every day | ORAL | Status: DC
  Administered 2017-02-18 – 2017-02-19 (×2): 75 mg via ORAL
  Filled 2017-02-17 (×2): qty 1

## 2017-02-17 MED ORDER — ACETAMINOPHEN 325 MG PO TABS: 650.0000 mg | ORAL_TABLET | ORAL | Status: DC | PRN

## 2017-02-17 MED ORDER — ATORVASTATIN CALCIUM 20 MG PO TABS
40.0000 mg | ORAL_TABLET | Freq: Every day | ORAL | Status: DC
  Administered 2017-02-17 – 2017-02-18 (×2): 40 mg via ORAL
  Filled 2017-02-17 (×2): qty 2

## 2017-02-17 MED ORDER — INSULIN ASPART 100 UNIT/ML ~~LOC~~ SOLN: 0.0000 [IU] | Freq: Every day | SUBCUTANEOUS | Status: DC

## 2017-02-17 MED ORDER — STROKE: EARLY STAGES OF RECOVERY BOOK
Freq: Once | Status: AC
  Administered 2017-02-17: 16:00:00

## 2017-02-17 MED ORDER — INSULIN ASPART PROT & ASPART (70-30 MIX) 100 UNIT/ML ~~LOC~~ SUSP
30.0000 [IU] | Freq: Two times a day (BID) | SUBCUTANEOUS | Status: DC
  Administered 2017-02-17 – 2017-02-19 (×4): 30 [IU] via SUBCUTANEOUS
  Filled 2017-02-17 (×4): qty 30

## 2017-02-17 MED ORDER — VITAMIN D (ERGOCALCIFEROL) 1.25 MG (50000 UNIT) PO CAPS
50000.0000 [IU] | ORAL_CAPSULE | ORAL | Status: DC
Start: 2017-02-18 — End: 2017-02-19
  Administered 2017-02-18: 10:00:00 50000 [IU] via ORAL
  Filled 2017-02-17: qty 1

## 2017-02-17 MED ORDER — METFORMIN HCL 500 MG PO TABS
1000.0000 mg | ORAL_TABLET | Freq: Two times a day (BID) | ORAL | Status: DC
Start: 2017-02-17 — End: 2017-02-19
  Administered 2017-02-17 – 2017-02-19 (×4): 1000 mg via ORAL
  Filled 2017-02-17 (×4): qty 2

## 2017-02-17 MED ORDER — PREGABALIN 75 MG PO CAPS
225.0000 mg | ORAL_CAPSULE | Freq: Two times a day (BID) | ORAL | Status: DC
  Administered 2017-02-17 – 2017-02-19 (×5): 225 mg via ORAL
  Filled 2017-02-17 (×5): qty 3

## 2017-02-17 MED ORDER — ROPINIROLE HCL 1 MG PO TABS
0.5000 mg | ORAL_TABLET | Freq: Every evening | ORAL | Status: DC
  Administered 2017-02-17 – 2017-02-18 (×2): 0.5 mg via ORAL
  Filled 2017-02-17 (×2): qty 1

## 2017-02-17 MED ORDER — INSULIN ASPART 100 UNIT/ML ~~LOC~~ SOLN
0.0000 [IU] | Freq: Three times a day (TID) | SUBCUTANEOUS | Status: DC
  Administered 2017-02-18 – 2017-02-19 (×3): 1 [IU] via SUBCUTANEOUS
  Administered 2017-02-19: 12:00:00 3 [IU] via SUBCUTANEOUS
  Filled 2017-02-17: qty 1
  Filled 2017-02-17: qty 3
  Filled 2017-02-17 (×2): qty 1

## 2017-02-17 MED ORDER — IOPAMIDOL (ISOVUE-370) INJECTION 76%
75.0000 mL | Freq: Once | INTRAVENOUS | Status: AC | PRN
  Administered 2017-02-17: 15:00:00 75 mL via INTRAVENOUS

## 2017-02-17 MED ORDER — LISINOPRIL 20 MG PO TABS
20.0000 mg | ORAL_TABLET | Freq: Every day | ORAL | Status: DC
  Administered 2017-02-17 – 2017-02-19 (×3): 20 mg via ORAL
  Filled 2017-02-17 (×3): qty 1

## 2017-02-17 MED ORDER — ACETAMINOPHEN 650 MG RE SUPP: 650.0000 mg | RECTAL | Status: DC | PRN

## 2017-02-17 MED ORDER — GLIPIZIDE 10 MG PO TABS
10.0000 mg | ORAL_TABLET | Freq: Two times a day (BID) | ORAL | Status: DC
  Administered 2017-02-17 – 2017-02-19 (×4): 10 mg via ORAL
  Filled 2017-02-17 (×4): qty 1

## 2017-02-17 MED ORDER — ACETAMINOPHEN 160 MG/5ML PO SOLN: 650.0000 mg | ORAL | Status: DC | PRN

## 2017-02-17 MED ORDER — HYDROCHLOROTHIAZIDE 12.5 MG PO CAPS
12.5000 mg | ORAL_CAPSULE | Freq: Every day | ORAL | Status: DC
  Administered 2017-02-17 – 2017-02-19 (×3): 12.5 mg via ORAL
  Filled 2017-02-17 (×3): qty 1

## 2017-02-17 MED ORDER — CITALOPRAM HYDROBROMIDE 20 MG PO TABS
20.0000 mg | ORAL_TABLET | Freq: Every day | ORAL | Status: DC
  Administered 2017-02-19: 20 mg via ORAL
  Filled 2017-02-17 (×2): qty 1

## 2017-02-17 MED ORDER — ASPIRIN 81 MG PO CHEW
81.0000 mg | CHEWABLE_TABLET | Freq: Every day | ORAL | Status: DC
  Administered 2017-02-17 – 2017-02-19 (×3): 81 mg via ORAL
  Filled 2017-02-17 (×3): qty 1

## 2017-02-17 MED ORDER — ENOXAPARIN SODIUM 40 MG/0.4ML ~~LOC~~ SOLN
40.0000 mg | SUBCUTANEOUS | Status: DC
  Administered 2017-02-17 – 2017-02-18 (×2): 40 mg via SUBCUTANEOUS
  Filled 2017-02-17 (×2): qty 0.4

## 2017-02-17 MED ORDER — INSULIN NPH (HUMAN) (ISOPHANE) 100 UNIT/ML ~~LOC~~ SUSP: 30.0000 [IU] | Freq: Two times a day (BID) | SUBCUTANEOUS | Status: DC

## 2017-02-17 NOTE — Progress Notes (Signed)
[place] responded to a page for code stroke Pt in ED04. [place] arrived when Dc was evaluating the Pt. [place] talked to Pt and her 2 daughters, CH encourage Pt, and prayed with the Pt and her daughters. [place] to have a follow up visit with this Pt.     02/17/17 1200  Clinical Encounter Type  Visited With Patient;Patient and family together  Visit Type Initial;Follow-up;Spiritual support;Code;Trauma  Referral From Nurse  Consult/Referral To Chaplain  Spiritual Encounters  Spiritual Needs Prayer;Emotional;Other (Comment)

## 2017-02-17 NOTE — ED Notes (Signed)
Waiting on pt to room. Daughter reports pt has had right arm weakness since stroke but at 9 AM today began with unsteady gait, right arm numbness, and slurred speech which are new.

## 2017-02-17 NOTE — Progress Notes (Signed)
OT Cancellation Note  Patient Details Name: Kimberly Duarte MRN: [id number] DOB: 10-Jun-1941   Cancelled Treatment:    Reason Eval/Treat Not Completed: Patient at procedure or test/ unavailable. Order received, chart reviewed. Pt off floor for testing at this time. Will re-attempt OT evaluation at later date/time as pt is available.  Jeni Salles, MPH, MS, OTR/L ascom [phone] 02/17/17, 3:08 PM

## 2017-02-17 NOTE — Consult Note (Signed)
Referring Physician: Corky Downs    Chief Complaint: Right sided numbness  HPI: Kimberly Duarte is an 76 y.o. female admitted on 5/19 due to a left thalamic infarct and right sided weakness and numbness who was on her way to her cardiologist today and had the onset of right sided numbness and slurred speech.  Patient was sent to the ED where a code stroke was called.  Initial NIHSS of 4. Patient also seen in February of this year with left facial droop.      Date last known well: Date: 02/17/2017 Time last known well: Time: 09:00 tPA Given: No: Recent infarct  Past Medical History:  Diagnosis Date  . Depression   . Diabetes mellitus without complication ([place])   . Diabetic peripheral neuropathy ([place])   . GERD (gastroesophageal reflux disease)   . Hyperlipidemia   . Hypertension   . Obesity   . Stroke ([place])   . Uterine cancer [facility])     Past Surgical History:  Procedure Laterality Date  . ABDOMINAL HYSTERECTOMY    . COLONOSCOPY    . COLONOSCOPY WITH PROPOFOL N/A 09/04/2015   Procedure: COLONOSCOPY WITH PROPOFOL;  Surgeon: Manya Silvas, MD;  Location: [facility] ENDOSCOPY;  Service: Endoscopy;  Laterality: N/A;  . ESOPHAGOGASTRODUODENOSCOPY (EGD) WITH PROPOFOL N/A 09/04/2015   Procedure: ESOPHAGOGASTRODUODENOSCOPY (EGD) WITH PROPOFOL;  Surgeon: Manya Silvas, MD;  Location: [facility] ENDOSCOPY;  Service: Endoscopy;  Laterality: N/A;  . UPPER GI ENDOSCOPY      Family History  Problem Relation Age of Onset  . Breast cancer Sister 17  . Colon cancer Mother   . Heart disease Father    Social History:  reports that she has never smoked. She has never used smokeless tobacco. She reports that she does not drink alcohol or use drugs.  Allergies:  Allergies  Allergen Reactions  . Actos [Pioglitazone] Other (See Comments)    Reaction:  Stomach cramps   . Byetta 10 Mcg Pen [Exenatide] Nausea And Vomiting  . Victoza [Liraglutide] Other (See Comments)    Reaction:  Burning and stinging of  feet      Medications: I have reviewed the patient's current medications. Prior to Admission:  Prior to Admission medications   Medication Sig Start Date End Date Taking? Authorizing Provider  aspirin 81 MG chewable tablet Chew 1 tablet (81 mg total) by mouth daily. 02/16/17  Yes Bettey Costa, MD  atorvastatin (LIPITOR) 40 MG tablet Take 1 tablet (40 mg total) by mouth daily at 6 PM. 11/27/16  Yes Sudini, Srikar, MD  citalopram (CELEXA) 20 MG tablet Take 20 mg by mouth daily.   Yes [provider]  clopidogrel (PLAVIX) 75 MG tablet Take 1 tablet (75 mg total) by mouth daily. 02/16/17  Yes Mody, Ulice Bold, MD  Exenatide ER 2 MG/0.85ML AUIJ Inject 2 mg into the skin once a week.   Yes [provider]  glipiZIDE (GLUCOTROL) 10 MG tablet Take 10 mg by mouth 2 (two) times daily.    Yes [provider]  hydrochlorothiazide (MICROZIDE) 12.5 MG capsule Take 12.5 mg by mouth daily.   Yes [provider]  insulin NPH Human (HUMULIN N,NOVOLIN N) 100 UNIT/ML injection Inject 15-30 Units into the skin 3 (three) times daily. tk 30 units qam and 30 units qhs, and if sugar is high during the afternoon, tk an additional 15 units   Yes [provider]  lisinopril (PRINIVIL,ZESTRIL) 20 MG tablet Take 20 mg by mouth daily.   Yes [provider]  meloxicam (MOBIC) 15 MG tablet Take 15 mg by mouth daily.   Yes [provider]  metFORMIN (GLUCOPHAGE) 1000 MG tablet Take 1,000 mg by mouth 2 (two) times daily with a meal.   Yes [provider]  omeprazole (PRILOSEC) 20 MG capsule Take 20 mg by mouth daily as needed.    Yes [provider]  pregabalin (LYRICA) 225 MG capsule Take 225 mg by mouth 2 (two) times daily.   Yes [provider]  rOPINIRole (REQUIP) 0.5 MG tablet Take 0.5 mg by mouth every evening.   Yes [provider]  Vitamin D, Ergocalciferol, (DRISDOL) 50000 UNITS CAPS capsule Take 50,000 Units by mouth every 7  (seven) days.   Yes [provider]     ROS: History obtained from the patient  General ROS: negative for - chills, fatigue, fever, night sweats, weight gain or weight loss Psychological ROS: negative for - behavioral disorder, hallucinations, memory difficulties, mood swings or suicidal ideation Ophthalmic ROS: negative for - blurry vision, double vision, eye pain or loss of vision ENT ROS: negative for - epistaxis, nasal discharge, oral lesions, sore throat, tinnitus or vertigo Allergy and Immunology ROS: negative for - hives or itchy/watery eyes Hematological and Lymphatic ROS: negative for - bleeding problems, bruising or swollen lymph nodes Endocrine ROS: negative for - galactorrhea, hair pattern changes, polydipsia/polyuria or temperature intolerance Respiratory ROS: negative for - cough, hemoptysis, shortness of breath or wheezing Cardiovascular ROS: negative for - chest pain, dyspnea on exertion, edema or irregular heartbeat Gastrointestinal ROS: negative for - abdominal pain, diarrhea, hematemesis, nausea/vomiting or stool incontinence Genito-Urinary ROS: negative for - dysuria, hematuria, incontinence or urinary frequency/urgency Musculoskeletal ROS: negative for - joint swelling or muscular weakness Neurological ROS: as noted in HPI Dermatological ROS: negative for rash and skin lesion changes  Physical Examination: Blood pressure (!) 154/65, pulse 69, temperature 98 F (36.7 C), temperature source Oral, resp. rate 14, height 5\' 3"  (1.6 m), weight 88.9 kg (196 lb), SpO2 95 %.  HEENT-  Normocephalic, no lesions, without obvious abnormality.  Normal external eye and conjunctiva.  Normal TM's bilaterally.  Normal auditory canals and external ears. Normal external nose, mucus membranes and septum.  Normal pharynx. Cardiovascular- S1, S2 normal, pulses palpable throughout   Lungs- chest clear, no wheezing, rales, normal symmetric air entry Abdomen- soft, non-tender; bowel  sounds normal; no masses,  no organomegaly Extremities- no edema Lymph-no adenopathy palpable Musculoskeletal-no joint tenderness, deformity or swelling Skin-warm and dry, no hyperpigmentation, vitiligo, or suspicious lesions  Neurological Examination   Mental Status: Alert, oriented, thought content appropriate.  Speech fluent without evidence of aphasia with some mild slurring.  Able to follow 3 step commands without difficulty. Cranial Nerves: II: Discs flat bilaterally; Visual fields grossly normal, pupils equal, round, reactive to light and accommodation III,IV, VI: ptosis not present, extra-ocular motions intact bilaterally V,VII: decreased right NLF, facial light touch sensation decreased on the right VIII: hearing normal bilaterally IX,X: gag reflex present XI: bilateral shoulder shrug XII: midline tongue extension Motor: Right : Upper extremity   5-/5    Left:     Upper extremity   5/5  Lower extremity   5-/5     Lower extremity   5/5 Tone and bulk:normal tone throughout; no atrophy noted Sensory: Pinprick and light touch decreased on the right upper and lower extremities Deep Tendon Reflexes: 2+ and symmetric throughout Plantars: Right: mute   Left: mute Cerebellar: Normal finger-to-nose and normal heel-to-shin testing bilaterally  Gait: not tested due to safety concerns    Laboratory Studies:  Basic Metabolic Panel:  Recent Labs Lab 02/15/17 1654 02/17/17 1037  NA 139 140  K 3.7 3.8  CL 104 105  CO2 23 24  GLUCOSE 211* 172*  BUN 16 12  CREATININE 0.87 0.86  CALCIUM 9.4 9.5    Liver Function Tests:  Recent Labs Lab 02/15/17 1654 02/17/17 1037  AST 21 26  ALT 18 18  ALKPHOS 65 63  BILITOT 0.6 0.7  PROT 7.3 7.5  ALBUMIN 4.0 4.2   No results for input(s): LIPASE, AMYLASE in the last 168 hours. No results for input(s): AMMONIA in the last 168 hours.  CBC:  Recent Labs Lab 02/15/17 1654 02/17/17 1037  WBC 9.4 8.7  NEUTROABS 5.5 5.0  HGB 14.0  14.6  HCT 41.1 43.0  MCV 86.5 86.3  PLT 199 207    Cardiac Enzymes:  Recent Labs Lab 02/15/17 1654 02/17/17 1037  TROPONINI <0.03 <0.03    BNP: Invalid input(s): POCBNP  CBG:  Recent Labs Lab 02/15/17 1702 02/15/17 2206 02/16/17 0757 02/17/17 1040  GLUCAP 191* 75 120* 166*    Microbiology: No results found for this or any previous visit.  Coagulation Studies:  Recent Labs  02/15/17 1654 02/17/17 1037  LABPROT 12.5 12.6  INR 0.93 0.94    Urinalysis:  Recent Labs Lab 02/15/17 1957  COLORURINE YELLOW*  LABSPEC 1.029  PHURINE 5.0  GLUCOSEU >=500*  HGBUR NEGATIVE  BILIRUBINUR NEGATIVE  KETONESUR NEGATIVE  PROTEINUR NEGATIVE  NITRITE NEGATIVE  LEUKOCYTESUR NEGATIVE    Lipid Panel:    Component Value Date/Time   CHOL 144 02/16/2017 0456   TRIG 213 (H) 02/16/2017 0456   HDL 28 (L) 02/16/2017 0456   CHOLHDL 5.1 02/16/2017 0456   VLDL 43 (H) 02/16/2017 0456   LDLCALC 73 02/16/2017 0456    HgbA1C:  Lab Results  Component Value Date   HGBA1C 9.4 (H) 02/16/2017    Urine Drug Screen:  No results found for: LABOPIA, COCAINSCRNUR, LABBENZ, AMPHETMU, THCU, LABBARB  Alcohol Level: No results for input(s): ETH in the last 168 hours.  Other results: EKG: sinus rhythm at 72 bpm.  Imaging: Mr Angiogram Head Wo Contrast  Result Date: 02/15/2017 CLINICAL DATA:  Acute onset dizziness, RIGHT extremity weakness while baking a cake at 1400 hours. Head numbness, occasional slurred speech. History of stroke, hypertension, hyperlipidemia and cancer. EXAM: MRI HEAD WITHOUT CONTRAST MRA HEAD WITHOUT CONTRAST MRA NECK WITHOUT AND WITH CONTRAST TECHNIQUE: Multiplanar, multiecho pulse sequences of the brain and surrounding structures were obtained without intravenous contrast. Angiographic images of the Circle of Willis were obtained using MRA technique without intravenous contrast. Angiographic images of the neck were obtained using MRA technique without and with  intravenous contrast. Carotid stenosis measurements (when applicable) are obtained utilizing NASCET criteria, using the distal internal carotid diameter as the denominator. CONTRAST:  18mL MULTIHANCE GADOBENATE DIMEGLUMINE 529 MG/ML IV SOLN COMPARISON:  MRI of the head November 27, 2016 and carotid ultrasound November 27, 2016 FINDINGS: MRI HEAD FINDINGS- mildly motion degraded examination. BRAIN: 9 mm ovoid faint reduced diffusion LEFT lateral thalamus parotid axial sequence, indistinct on the coronal DWI and no signal abnormality on remaining sequences. No susceptibility artifact to suggest hemorrhage. The ventricles and sulci are normal for patient's age, cavum velum interpositum. No suspicious parenchymal signal, masses or mass effect. Small area RIGHT frontal lobe encephalomalacia again noted. Scattered subcentimeter supratentorial white matter FLAIR T2 hyperintensities compatible with mild chronic small vessel  ischemic disease, less than expected for age. No abnormal extra-axial fluid collections. VASCULAR: Normal major intracranial vascular flow voids present at skull base. SKULL AND UPPER CERVICAL SPINE: No abnormal sellar expansion. No suspicious calvarial bone marrow signal. Craniocervical junction maintained. SINUSES/ORBITS: The mastoid air-cells and included paranasal sinuses are well-aerated. The included ocular globes and orbital contents are non-suspicious. OTHER: None. MRA HEAD FINDINGS- mildly motion degraded examination. ANTERIOR CIRCULATION: Normal flow related enhancement of the included cervical, petrous, cavernous and supraclinoid internal carotid arteries. Patent anterior communicating artery. Normal flow related enhancement of the anterior and middle cerebral arteries, including distal segments. Mild general luminal irregularity of the anterior and middle cerebral arteries. No large vessel occlusion, high-grade stenosis, or definite aneurysm. POSTERIOR CIRCULATION: RIGHT vertebral artery is  dominant. Basilar artery is patent, with normal flow related enhancement of the main branch vessels. Mild luminal regularity vertebral artery's. Normal flow related enhancement of the posterior cerebral arteries, moderate tandem stenoses. Robust bilateral posterior communicating arteries present. No large vessel occlusion, high-grade stenosis, or definite aneurysm. ANATOMIC VARIANTS: None. Source images and MIP images were reviewed. MRA NECK FINDINGS- mildly motion degraded examination. ANTERIOR CIRCULATION: The common carotid arteries are widely patent bilaterally. The carotid bifurcations are patent bilaterally without hemodynamically significant stenosis by NASCET criteria. No flow limiting stenosis or luminal irregularity. POSTERIOR CIRCULATION: Bilateral vertebral arteries are patent to the vertebrobasilar junction. No flow limiting stenosis or luminal irregularity. Source images and MIP images were reviewed. IMPRESSION: MRI HEAD: Mild motion degraded examination. Acute LEFT thalamus lacunar infarct versus motion artifact. 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 MRA HEAD: No emergent large vessel occlusion on this mildly motion degraded examination. Cerebral artery irregularity with moderate tandem stenoses PCA's can be seen with atherosclerosis or, due to motion artifact. MRA NECK: No hemodynamically significant stenosis or acute vascular process on this motion degraded examination. Electronically Signed   By: Elon Alas M.D.   On: 02/15/2017 19:41   Mr Jodene Nam Neck W Wo Contrast  Result Date: 02/15/2017 CLINICAL DATA:  Acute onset dizziness, RIGHT extremity weakness while baking a cake at 1400 hours. Head numbness, occasional slurred speech. History of stroke, hypertension, hyperlipidemia and cancer. EXAM: MRI HEAD WITHOUT CONTRAST MRA HEAD WITHOUT CONTRAST MRA NECK WITHOUT AND WITH CONTRAST TECHNIQUE: Multiplanar, multiecho pulse sequences of the brain and  surrounding structures were obtained without intravenous contrast. Angiographic images of the Circle of Willis were obtained using MRA technique without intravenous contrast. Angiographic images of the neck were obtained using MRA technique without and with intravenous contrast. Carotid stenosis measurements (when applicable) are obtained utilizing NASCET criteria, using the distal internal carotid diameter as the denominator. CONTRAST:  2mL MULTIHANCE GADOBENATE DIMEGLUMINE 529 MG/ML IV SOLN COMPARISON:  MRI of the head November 27, 2016 and carotid ultrasound November 27, 2016 FINDINGS: MRI HEAD FINDINGS- mildly motion degraded examination. BRAIN: 9 mm ovoid faint reduced diffusion LEFT lateral thalamus parotid axial sequence, indistinct on the coronal DWI and no signal abnormality on remaining sequences. No susceptibility artifact to suggest hemorrhage. The ventricles and sulci are normal for patient's age, cavum velum interpositum. No suspicious parenchymal signal, masses or mass effect. Small area RIGHT frontal lobe encephalomalacia again noted. Scattered subcentimeter supratentorial white matter FLAIR T2 hyperintensities compatible with mild chronic small vessel ischemic disease, less than expected for age. No abnormal extra-axial fluid collections. VASCULAR: Normal major intracranial vascular flow voids present at skull base. SKULL AND UPPER CERVICAL SPINE: No abnormal sellar expansion. No suspicious calvarial  bone marrow signal. Craniocervical junction maintained. SINUSES/ORBITS: The mastoid air-cells and included paranasal sinuses are well-aerated. The included ocular globes and orbital contents are non-suspicious. OTHER: None. MRA HEAD FINDINGS- mildly motion degraded examination. ANTERIOR CIRCULATION: Normal flow related enhancement of the included cervical, petrous, cavernous and supraclinoid internal carotid arteries. Patent anterior communicating artery. Normal flow related enhancement of the anterior  and middle cerebral arteries, including distal segments. Mild general luminal irregularity of the anterior and middle cerebral arteries. No large vessel occlusion, high-grade stenosis, or definite aneurysm. POSTERIOR CIRCULATION: RIGHT vertebral artery is dominant. Basilar artery is patent, with normal flow related enhancement of the main branch vessels. Mild luminal regularity vertebral artery's. Normal flow related enhancement of the posterior cerebral arteries, moderate tandem stenoses. Robust bilateral posterior communicating arteries present. No large vessel occlusion, high-grade stenosis, or definite aneurysm. ANATOMIC VARIANTS: None. Source images and MIP images were reviewed. MRA NECK FINDINGS- mildly motion degraded examination. ANTERIOR CIRCULATION: The common carotid arteries are widely patent bilaterally. The carotid bifurcations are patent bilaterally without hemodynamically significant stenosis by NASCET criteria. No flow limiting stenosis or luminal irregularity. POSTERIOR CIRCULATION: Bilateral vertebral arteries are patent to the vertebrobasilar junction. No flow limiting stenosis or luminal irregularity. Source images and MIP images were reviewed. IMPRESSION: MRI HEAD: Mild motion degraded examination. Acute LEFT thalamus lacunar infarct versus motion artifact. 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 MRA HEAD: No emergent large vessel occlusion on this mildly motion degraded examination. Cerebral artery irregularity with moderate tandem stenoses PCA's can be seen with atherosclerosis or, due to motion artifact. MRA NECK: No hemodynamically significant stenosis or acute vascular process on this motion degraded examination. Electronically Signed   By: Elon Alas M.D.   On: 02/15/2017 19:41   Mr Brain Wo Contrast  Result Date: 02/15/2017 CLINICAL DATA:  Acute onset dizziness, RIGHT extremity weakness while baking a cake at 1400 hours. Head  numbness, occasional slurred speech. History of stroke, hypertension, hyperlipidemia and cancer. EXAM: MRI HEAD WITHOUT CONTRAST MRA HEAD WITHOUT CONTRAST MRA NECK WITHOUT AND WITH CONTRAST TECHNIQUE: Multiplanar, multiecho pulse sequences of the brain and surrounding structures were obtained without intravenous contrast. Angiographic images of the Circle of Willis were obtained using MRA technique without intravenous contrast. Angiographic images of the neck were obtained using MRA technique without and with intravenous contrast. Carotid stenosis measurements (when applicable) are obtained utilizing NASCET criteria, using the distal internal carotid diameter as the denominator. CONTRAST:  63mL MULTIHANCE GADOBENATE DIMEGLUMINE 529 MG/ML IV SOLN COMPARISON:  MRI of the head November 27, 2016 and carotid ultrasound November 27, 2016 FINDINGS: MRI HEAD FINDINGS- mildly motion degraded examination. BRAIN: 9 mm ovoid faint reduced diffusion LEFT lateral thalamus parotid axial sequence, indistinct on the coronal DWI and no signal abnormality on remaining sequences. No susceptibility artifact to suggest hemorrhage. The ventricles and sulci are normal for patient's age, cavum velum interpositum. No suspicious parenchymal signal, masses or mass effect. Small area RIGHT frontal lobe encephalomalacia again noted. Scattered subcentimeter supratentorial white matter FLAIR T2 hyperintensities compatible with mild chronic small vessel ischemic disease, less than expected for age. No abnormal extra-axial fluid collections. VASCULAR: Normal major intracranial vascular flow voids present at skull base. SKULL AND UPPER CERVICAL SPINE: No abnormal sellar expansion. No suspicious calvarial bone marrow signal. Craniocervical junction maintained. SINUSES/ORBITS: The mastoid air-cells and included paranasal sinuses are well-aerated. The included ocular globes and orbital contents are non-suspicious. OTHER: None. MRA HEAD FINDINGS- mildly  motion degraded examination. ANTERIOR CIRCULATION:  Normal flow related enhancement of the included cervical, petrous, cavernous and supraclinoid internal carotid arteries. Patent anterior communicating artery. Normal flow related enhancement of the anterior and middle cerebral arteries, including distal segments. Mild general luminal irregularity of the anterior and middle cerebral arteries. No large vessel occlusion, high-grade stenosis, or definite aneurysm. POSTERIOR CIRCULATION: RIGHT vertebral artery is dominant. Basilar artery is patent, with normal flow related enhancement of the main branch vessels. Mild luminal regularity vertebral artery's. Normal flow related enhancement of the posterior cerebral arteries, moderate tandem stenoses. Robust bilateral posterior communicating arteries present. No large vessel occlusion, high-grade stenosis, or definite aneurysm. ANATOMIC VARIANTS: None. Source images and MIP images were reviewed. MRA NECK FINDINGS- mildly motion degraded examination. ANTERIOR CIRCULATION: The common carotid arteries are widely patent bilaterally. The carotid bifurcations are patent bilaterally without hemodynamically significant stenosis by NASCET criteria. No flow limiting stenosis or luminal irregularity. POSTERIOR CIRCULATION: Bilateral vertebral arteries are patent to the vertebrobasilar junction. No flow limiting stenosis or luminal irregularity. Source images and MIP images were reviewed. IMPRESSION: MRI HEAD: Mild motion degraded examination. Acute LEFT thalamus lacunar infarct versus motion artifact. 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 MRA HEAD: No emergent large vessel occlusion on this mildly motion degraded examination. Cerebral artery irregularity with moderate tandem stenoses PCA's can be seen with atherosclerosis or, due to motion artifact. MRA NECK: No hemodynamically significant stenosis or acute vascular process on this motion  degraded examination. Electronically Signed   By: Elon Alas M.D.   On: 02/15/2017 19:41   Ct Head Code Stroke W/o Cm  Result Date: 02/17/2017 CLINICAL DATA:  Code stroke. Right-sided weakness with slurred speech. EXAM: CT HEAD WITHOUT CONTRAST TECHNIQUE: Contiguous axial images were obtained from the base of the skull through the vertex without intravenous contrast. COMPARISON:  02/15/2017 FINDINGS: Brain: Low-density in the posterior limb left internal capsule correlating with previous MRI. No evidence of new infarct, hemorrhage, or hydrocephalus. Cavum velum interpositum, incidental. Vascular: Atherosclerotic calcification.  No hyperdense vessel. Skull: No acute finding Sinuses/Orbits: No acute finding Other: These results were called by telephone at the time of interpretation on 02/17/2017 at 10:37 am to Dr. Lavonia Drafts , who verbally acknowledged these results. ASPECTS [facility] Stroke Program Early CT Score) - Ganglionic level infarction (caudate, lentiform nuclei, internal capsule, insula, M1-M3 cortex): 7 - Supraganglionic infarction (M4-M6 cortex): 3 Total score (0-10 with 10 being normal): 10 IMPRESSION: 1. No acute finding when compared to CT 2 days prior. 2. Left posterior limb internal capsule/lateral thalamus infarct by MRI 2 days prior. Electronically Signed   By: Monte Fantasia M.D.   On: 02/17/2017 10:40   Ct Head Code Stroke W/o Cm  Result Date: 02/15/2017 CLINICAL DATA:  Code stroke. Bilateral leg numbness, RIGHT-sided weakness while baking a cake 1 hour ago. History of stroke, hypertension, hyperlipidemia and diabetes. EXAM: CT HEAD WITHOUT CONTRAST TECHNIQUE: Contiguous axial images were obtained from the base of the skull through the vertex without intravenous contrast. COMPARISON:  MRI of the head November 27, 2016 FINDINGS: BRAIN: No intraparenchymal hemorrhage, mass effect nor midline shift. The ventricles and sulci are normal for age, cavum velum interpositum. Patchy  supratentorial white matter hypodensities within normal range for patient's age, though non-specific are most compatible with chronic small vessel ischemic disease. Patient's old small RIGHT frontal infarct not apparent by CT. No acute large vascular territory infarcts. No abnormal extra-axial fluid collections. Basal cisterns are patent. VASCULAR: Mild calcific atherosclerosis of the carotid  siphons. SKULL: No skull fracture. No significant scalp soft tissue swelling. SINUSES/ORBITS: The mastoid air-cells and included paranasal sinuses are well-aerated.The included ocular globes and orbital contents are non-suspicious. OTHER: None. ASPECTS [facility] Stroke Program Early CT Score) - Ganglionic level infarction (caudate, lentiform nuclei, internal capsule, insula, M1-M3 cortex): 7 - Supraganglionic infarction (M4-M6 cortex): 3 Total score (0-10 with 10 being normal): 10 IMPRESSION: 1. No acute intracranial process. Mild chronic small vessel ischemic disease. 2. ASPECTS is 10. Critical Value/emergent results were called by telephone at the time of interpretation on 02/15/2017 at 5:17 pm to Dr. Rudene Re , who verbally acknowledged these results. Electronically Signed   By: Elon Alas M.D.   On: 02/15/2017 17:23    Assessment: 76 y.o. female presenting with worsening right sided symptoms.  Recent left thalamic infarct.  Symptoms consistent with this site.  Presentation may be related to this infarct.  Will rule out another acute event though.  During patient's previous admission LDL 73, A1c 9.4.  Head CT today reviewed and shows no acute changes.  Echo and carotids performed within the past three months and unremarkable.  EF 55-65%.    Stroke Risk Factors - diabetes mellitus, hyperlipidemia and hypertension  Plan: 1. Blood sugar management 2. MRI of the brain without contrast 3. PT consult, OT consult, Speech consult 4. CTA of the head and neck 5. Prophylactic therapy-Continue ASA and  Plavix 6. NPO until RN stroke swallow screen 7. Telemetry monitoring 8. Frequent neuro checks  Case discussed with Dr. Carma Lair, MD Neurology [phone] 02/17/2017, 12:15 PM

## 2017-02-17 NOTE — ED Triage Notes (Signed)
Approx 9am noted unsteady gait. States R sided weakness however grips noted equal. Noted unsteady gait which daughter states started at [facility] clear. To CT.

## 2017-02-17 NOTE — Progress Notes (Signed)
SLP Cancellation Note  Patient Details Name: Kimberly Duarte MRN: [id number] DOB: 10-Oct-1940   Cancelled treatment:       Reason Eval/Treat Not Completed:  (pt screened post admitting to room. NSG present. )  Chart reviewed; consulted NSG and met w/ pt, family. Pt stated she felt her speech has improved since this morning. She denied any swallowing problems; passed the swallow screen in the ED during admission.  Pt is leaving for tests at this time. ST services will f/u tomorrow to meet w/ pt again to screen as indicated.  Pt/family and NSG agreed.   Orinda Kenner, MS, CCC-SLP Madelina Sanda 02/17/2017, 2:23 PM

## 2017-02-17 NOTE — H&P (Signed)
[facility] NAME: Kimberly Duarte    MR#:  [id number]  DATE OF BIRTH:  November 07, 1940  DATE OF ADMISSION:  02/17/2017  PRIMARY CARE PHYSICIAN: Tracie Harrier, MD   REQUESTING/REFERRING PHYSICIAN: Corky Downs  CHIEF COMPLAINT:  Right upper extremity numbness and weakness with worsening of the right lower extremity weakness  HISTORY OF PRESENT ILLNESS:  Kimberly Duarte  is a 76 y.o. female with a known history of Diabetes mellitus, GERD, hyperlipidemia, hypertension was just admitted to Hospital on May 19 due to a left thalamic infarct and right sided weakness and numbness and was discharged on May 20 with baby aspirin and Plavix. Today while she was on her way to see her cardiologist she has developed right upper extremity heaviness and numbness which is new. Patient also noticed her right lower extremity weakness getting worse and slurry speech. Patient was sent over to the emergency department for code stroke. Initial NIHSS of 4.. CT head is negative  PAST MEDICAL HISTORY:   Past Medical History:  Diagnosis Date  . Depression   . Diabetes mellitus without complication ([place])   . Diabetic peripheral neuropathy ([place])   . GERD (gastroesophageal reflux disease)   . Hyperlipidemia   . Hypertension   . Obesity   . Stroke ([place])   . Uterine cancer ([place])     PAST SURGICAL HISTOIRY:   Past Surgical History:  Procedure Laterality Date  . ABDOMINAL HYSTERECTOMY    . COLONOSCOPY    . COLONOSCOPY WITH PROPOFOL N/A 09/04/2015   Procedure: COLONOSCOPY WITH PROPOFOL;  Surgeon: Manya Silvas, MD;  [facility];  Service: Endoscopy;  Laterality: N/A;  . ESOPHAGOGASTRODUODENOSCOPY (EGD) WITH PROPOFOL N/A 09/04/2015   Procedure: ESOPHAGOGASTRODUODENOSCOPY (EGD) WITH PROPOFOL;  Surgeon: Manya Silvas, MD;  Location: [facility] ENDOSCOPY;  Service: Endoscopy;  Laterality: N/A;  . UPPER GI ENDOSCOPY      SOCIAL HISTORY:   Social  History  Substance Use Topics  . Smoking status: Never Smoker  . Smokeless tobacco: Never Used  . Alcohol use No    FAMILY HISTORY:   Family History  Problem Relation Age of Onset  . Breast cancer Sister 23  . Colon cancer Mother   . Heart disease Father     DRUG ALLERGIES:   Allergies  Allergen Reactions  . Actos [Pioglitazone] Other (See Comments)    Reaction:  Stomach cramps   . Byetta 10 Mcg Pen [Exenatide] Nausea And Vomiting  . Victoza [Liraglutide] Other (See Comments)    Reaction:  Burning and stinging of feet      REVIEW OF SYSTEMS:  CONSTITUTIONAL: No fever, fatigue or weakness.  EYES: No blurred or double vision.  EARS, NOSE, AND THROAT: No tinnitus or ear pain.  RESPIRATORY: No cough, shortness of breath, wheezing or hemoptysis.  CARDIOVASCULAR: No chest pain, orthopnea, edema.  GASTROINTESTINAL: No nausea, vomiting, diarrhea or abdominal pain.  GENITOURINARY: No dysuria, hematuria.  ENDOCRINE: No polyuria, nocturia,  HEMATOLOGY: No anemia, easy bruising or bleeding SKIN: No rash or lesion. MUSCULOSKELETAL: No joint pain or arthritis.   NEUROLOGIC: Right upper extremity heaviness and worsening right lower extremity weakness, slurred speech  PSYCHIATRY: No anxiety or depression.   MEDICATIONS AT HOME:   Prior to Admission medications   Medication Sig Start Date End Date Taking? Authorizing Provider  aspirin 81 MG chewable tablet Chew 1 tablet (81 mg total) by mouth daily. 02/16/17  Yes Bettey Costa, MD  atorvastatin (LIPITOR) 40 MG  tablet Take 1 tablet (40 mg total) by mouth daily at 6 PM. 11/27/16  Yes Sudini, Srikar, MD  citalopram (CELEXA) 20 MG tablet Take 20 mg by mouth daily.   Yes [provider]  clopidogrel (PLAVIX) 75 MG tablet Take 1 tablet (75 mg total) by mouth daily. 02/16/17  Yes Mody, Ulice Bold, MD  Exenatide ER 2 MG/0.85ML AUIJ Inject 2 mg into the skin once a week.   Yes [provider]  glipiZIDE (GLUCOTROL) 10 MG tablet Take  10 mg by mouth 2 (two) times daily.    Yes [provider]  hydrochlorothiazide (MICROZIDE) 12.5 MG capsule Take 12.5 mg by mouth daily.   Yes [provider]  insulin NPH Human (HUMULIN N,NOVOLIN N) 100 UNIT/ML injection Inject 15-30 Units into the skin 3 (three) times daily. tk 30 units qam and 30 units qhs, and if sugar is high during the afternoon, tk an additional 15 units   Yes [provider]  lisinopril (PRINIVIL,ZESTRIL) 20 MG tablet Take 20 mg by mouth daily.   Yes [provider]  meloxicam (MOBIC) 15 MG tablet Take 15 mg by mouth daily.   Yes [provider]  metFORMIN (GLUCOPHAGE) 1000 MG tablet Take 1,000 mg by mouth 2 (two) times daily with a meal.   Yes [provider]  omeprazole (PRILOSEC) 20 MG capsule Take 20 mg by mouth daily as needed.    Yes [provider]  pregabalin (LYRICA) 225 MG capsule Take 225 mg by mouth 2 (two) times daily.   Yes [provider]  rOPINIRole (REQUIP) 0.5 MG tablet Take 0.5 mg by mouth every evening.   Yes [provider]  Vitamin D, Ergocalciferol, (DRISDOL) 50000 UNITS CAPS capsule Take 50,000 Units by mouth every 7 (seven) days.   Yes [provider]      VITAL SIGNS:  Blood pressure (!) 163/75, pulse 67, temperature 98 F (36.7 C), temperature source Oral, resp. rate 16, height 5\' 3"  (1.6 m), weight 88.9 kg (196 lb), SpO2 97 %.  PHYSICAL EXAMINATION:  GENERAL:  77 y.o.-year-old patient lying in the bed with no acute distress.  EYES: Pupils equal, round, reactive to light and accommodation. No scleral icterus. Extraocular muscles intact.  HEENT: Head atraumatic, normocephalic. Oropharynx and nasopharynx clear.  NECK:  Supple, no jugular venous distention. No thyroid enlargement, no tenderness.  LUNGS: Normal breath sounds bilaterally, no wheezing, rales,rhonchi or crepitation. No use of accessory muscles of respiration.  CARDIOVASCULAR: S1, S2 normal. No  murmurs, rubs, or gallops.  ABDOMEN: Soft, nontender, nondistended. Bowel sounds present. No organomegaly or mass.  EXTREMITIES: No pedal edema, cyanosis, or clubbing.  NEUROLOGIC: Cranial nerves II through XII are intact. Muscle strength 5/5 in all extremitiesExcept right lower and right upper extremity with 4 out of 5. Sensation intact. Gait not checked. No facial droop, no pronator drift PSYCHIATRIC: The patient is alert and oriented x 3.  SKIN: No obvious rash, lesion, or ulcer.   LABORATORY PANEL:   CBC  Recent Labs Lab 02/17/17 1037  WBC 8.7  HGB 14.6  HCT 43.0  PLT 207   ------------------------------------------------------------------------------------------------------------------  Chemistries   Recent Labs Lab 02/17/17 1037  NA 140  K 3.8  CL 105  CO2 24  GLUCOSE 172*  BUN 12  CREATININE 0.86  CALCIUM 9.5  AST 26  ALT 18  ALKPHOS 63  BILITOT 0.7   ------------------------------------------------------------------------------------------------------------------  Cardiac Enzymes  Recent Labs Lab 02/17/17 1037  TROPONINI <0.03   ------------------------------------------------------------------------------------------------------------------  RADIOLOGY:  Mr Angiogram Head Wo Contrast  Result Date: 02/15/2017 CLINICAL DATA:  Acute onset dizziness, RIGHT extremity weakness while baking a cake at 1400 hours. Head numbness, occasional slurred speech. History of stroke, hypertension, hyperlipidemia and cancer. EXAM: MRI HEAD WITHOUT CONTRAST MRA HEAD WITHOUT CONTRAST MRA NECK WITHOUT AND WITH CONTRAST TECHNIQUE: Multiplanar, multiecho pulse sequences of the brain and surrounding structures were obtained without intravenous contrast. Angiographic images of the Circle of Willis were obtained using MRA technique without intravenous contrast. Angiographic images of the neck were obtained using MRA technique without and with intravenous contrast. Carotid stenosis  measurements (when applicable) are obtained utilizing NASCET criteria, using the distal internal carotid diameter as the denominator. CONTRAST:  18mL MULTIHANCE GADOBENATE DIMEGLUMINE 529 MG/ML IV SOLN COMPARISON:  MRI of the head November 27, 2016 and carotid ultrasound November 27, 2016 FINDINGS: MRI HEAD FINDINGS- mildly motion degraded examination. BRAIN: 9 mm ovoid faint reduced diffusion LEFT lateral thalamus parotid axial sequence, indistinct on the coronal DWI and no signal abnormality on remaining sequences. No susceptibility artifact to suggest hemorrhage. The ventricles and sulci are normal for patient's age, cavum velum interpositum. No suspicious parenchymal signal, masses or mass effect. Small area RIGHT frontal lobe encephalomalacia again noted. Scattered subcentimeter supratentorial white matter FLAIR T2 hyperintensities compatible with mild chronic small vessel ischemic disease, less than expected for age. No abnormal extra-axial fluid collections. VASCULAR: Normal major intracranial vascular flow voids present at skull base. SKULL AND UPPER CERVICAL SPINE: No abnormal sellar expansion. No suspicious calvarial bone marrow signal. Craniocervical junction maintained. SINUSES/ORBITS: The mastoid air-cells and included paranasal sinuses are well-aerated. The included ocular globes and orbital contents are non-suspicious. OTHER: None. MRA HEAD FINDINGS- mildly motion degraded examination. ANTERIOR CIRCULATION: Normal flow related enhancement of the included cervical, petrous, cavernous and supraclinoid internal carotid arteries. Patent anterior communicating artery. Normal flow related enhancement of the anterior and middle cerebral arteries, including distal segments. Mild general luminal irregularity of the anterior and middle cerebral arteries. No large vessel occlusion, high-grade stenosis, or definite aneurysm. POSTERIOR CIRCULATION: RIGHT vertebral artery is dominant. Basilar artery is patent, with  normal flow related enhancement of the main branch vessels. Mild luminal regularity vertebral artery's. Normal flow related enhancement of the posterior cerebral arteries, moderate tandem stenoses. Robust bilateral posterior communicating arteries present. No large vessel occlusion, high-grade stenosis, or definite aneurysm. ANATOMIC VARIANTS: None. Source images and MIP images were reviewed. MRA NECK FINDINGS- mildly motion degraded examination. ANTERIOR CIRCULATION: The common carotid arteries are widely patent bilaterally. The carotid bifurcations are patent bilaterally without hemodynamically significant stenosis by NASCET criteria. No flow limiting stenosis or luminal irregularity. POSTERIOR CIRCULATION: Bilateral vertebral arteries are patent to the vertebrobasilar junction. No flow limiting stenosis or luminal irregularity. Source images and MIP images were reviewed. IMPRESSION: MRI HEAD: Mild motion degraded examination. Acute LEFT thalamus lacunar infarct versus motion artifact. 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 MRA HEAD: No emergent large vessel occlusion on this mildly motion degraded examination. Cerebral artery irregularity with moderate tandem stenoses PCA's can be seen with atherosclerosis or, due to motion artifact. MRA NECK: No hemodynamically significant stenosis or acute vascular process on this motion degraded examination. Electronically Signed   By: Elon Alas M.D.   On: 02/15/2017 19:41   Mr Jodene Nam Neck W Wo Contrast  Result Date: 02/15/2017 CLINICAL DATA:  Acute onset dizziness, RIGHT extremity weakness while baking a cake at 1400 hours. Head numbness, occasional slurred speech. History of stroke,  hypertension, hyperlipidemia and cancer. EXAM: MRI HEAD WITHOUT CONTRAST MRA HEAD WITHOUT CONTRAST MRA NECK WITHOUT AND WITH CONTRAST TECHNIQUE: Multiplanar, multiecho pulse sequences of the brain and surrounding structures were obtained  without intravenous contrast. Angiographic images of the Circle of Willis were obtained using MRA technique without intravenous contrast. Angiographic images of the neck were obtained using MRA technique without and with intravenous contrast. Carotid stenosis measurements (when applicable) are obtained utilizing NASCET criteria, using the distal internal carotid diameter as the denominator. CONTRAST:  25mL MULTIHANCE GADOBENATE DIMEGLUMINE 529 MG/ML IV SOLN COMPARISON:  MRI of the head November 27, 2016 and carotid ultrasound November 27, 2016 FINDINGS: MRI HEAD FINDINGS- mildly motion degraded examination. BRAIN: 9 mm ovoid faint reduced diffusion LEFT lateral thalamus parotid axial sequence, indistinct on the coronal DWI and no signal abnormality on remaining sequences. No susceptibility artifact to suggest hemorrhage. The ventricles and sulci are normal for patient's age, cavum velum interpositum. No suspicious parenchymal signal, masses or mass effect. Small area RIGHT frontal lobe encephalomalacia again noted. Scattered subcentimeter supratentorial white matter FLAIR T2 hyperintensities compatible with mild chronic small vessel ischemic disease, less than expected for age. No abnormal extra-axial fluid collections. VASCULAR: Normal major intracranial vascular flow voids present at skull base. SKULL AND UPPER CERVICAL SPINE: No abnormal sellar expansion. No suspicious calvarial bone marrow signal. Craniocervical junction maintained. SINUSES/ORBITS: The mastoid air-cells and included paranasal sinuses are well-aerated. The included ocular globes and orbital contents are non-suspicious. OTHER: None. MRA HEAD FINDINGS- mildly motion degraded examination. ANTERIOR CIRCULATION: Normal flow related enhancement of the included cervical, petrous, cavernous and supraclinoid internal carotid arteries. Patent anterior communicating artery. Normal flow related enhancement of the anterior and middle cerebral arteries,  including distal segments. Mild general luminal irregularity of the anterior and middle cerebral arteries. No large vessel occlusion, high-grade stenosis, or definite aneurysm. POSTERIOR CIRCULATION: RIGHT vertebral artery is dominant. Basilar artery is patent, with normal flow related enhancement of the main branch vessels. Mild luminal regularity vertebral artery's. Normal flow related enhancement of the posterior cerebral arteries, moderate tandem stenoses. Robust bilateral posterior communicating arteries present. No large vessel occlusion, high-grade stenosis, or definite aneurysm. ANATOMIC VARIANTS: None. Source images and MIP images were reviewed. MRA NECK FINDINGS- mildly motion degraded examination. ANTERIOR CIRCULATION: The common carotid arteries are widely patent bilaterally. The carotid bifurcations are patent bilaterally without hemodynamically significant stenosis by NASCET criteria. No flow limiting stenosis or luminal irregularity. POSTERIOR CIRCULATION: Bilateral vertebral arteries are patent to the vertebrobasilar junction. No flow limiting stenosis or luminal irregularity. Source images and MIP images were reviewed. IMPRESSION: MRI HEAD: Mild motion degraded examination. Acute LEFT thalamus lacunar infarct versus motion artifact. 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 MRA HEAD: No emergent large vessel occlusion on this mildly motion degraded examination. Cerebral artery irregularity with moderate tandem stenoses PCA's can be seen with atherosclerosis or, due to motion artifact. MRA NECK: No hemodynamically significant stenosis or acute vascular process on this motion degraded examination. Electronically Signed   By: Elon Alas M.D.   On: 02/15/2017 19:41   Mr Brain Wo Contrast  Result Date: 02/15/2017 CLINICAL DATA:  Acute onset dizziness, RIGHT extremity weakness while baking a cake at 1400 hours. Head numbness, occasional slurred speech.  History of stroke, hypertension, hyperlipidemia and cancer. EXAM: MRI HEAD WITHOUT CONTRAST MRA HEAD WITHOUT CONTRAST MRA NECK WITHOUT AND WITH CONTRAST TECHNIQUE: Multiplanar, multiecho pulse sequences of the brain and surrounding structures were obtained without intravenous contrast. Angiographic  images of the Circle of Willis were obtained using MRA technique without intravenous contrast. Angiographic images of the neck were obtained using MRA technique without and with intravenous contrast. Carotid stenosis measurements (when applicable) are obtained utilizing NASCET criteria, using the distal internal carotid diameter as the denominator. CONTRAST:  62mL MULTIHANCE GADOBENATE DIMEGLUMINE 529 MG/ML IV SOLN COMPARISON:  MRI of the head November 27, 2016 and carotid ultrasound November 27, 2016 FINDINGS: MRI HEAD FINDINGS- mildly motion degraded examination. BRAIN: 9 mm ovoid faint reduced diffusion LEFT lateral thalamus parotid axial sequence, indistinct on the coronal DWI and no signal abnormality on remaining sequences. No susceptibility artifact to suggest hemorrhage. The ventricles and sulci are normal for patient's age, cavum velum interpositum. No suspicious parenchymal signal, masses or mass effect. Small area RIGHT frontal lobe encephalomalacia again noted. Scattered subcentimeter supratentorial white matter FLAIR T2 hyperintensities compatible with mild chronic small vessel ischemic disease, less than expected for age. No abnormal extra-axial fluid collections. VASCULAR: Normal major intracranial vascular flow voids present at skull base. SKULL AND UPPER CERVICAL SPINE: No abnormal sellar expansion. No suspicious calvarial bone marrow signal. Craniocervical junction maintained. SINUSES/ORBITS: The mastoid air-cells and included paranasal sinuses are well-aerated. The included ocular globes and orbital contents are non-suspicious. OTHER: None. MRA HEAD FINDINGS- mildly motion degraded examination. ANTERIOR  CIRCULATION: Normal flow related enhancement of the included cervical, petrous, cavernous and supraclinoid internal carotid arteries. Patent anterior communicating artery. Normal flow related enhancement of the anterior and middle cerebral arteries, including distal segments. Mild general luminal irregularity of the anterior and middle cerebral arteries. No large vessel occlusion, high-grade stenosis, or definite aneurysm. POSTERIOR CIRCULATION: RIGHT vertebral artery is dominant. Basilar artery is patent, with normal flow related enhancement of the main branch vessels. Mild luminal regularity vertebral artery's. Normal flow related enhancement of the posterior cerebral arteries, moderate tandem stenoses. Robust bilateral posterior communicating arteries present. No large vessel occlusion, high-grade stenosis, or definite aneurysm. ANATOMIC VARIANTS: None. Source images and MIP images were reviewed. MRA NECK FINDINGS- mildly motion degraded examination. ANTERIOR CIRCULATION: The common carotid arteries are widely patent bilaterally. The carotid bifurcations are patent bilaterally without hemodynamically significant stenosis by NASCET criteria. No flow limiting stenosis or luminal irregularity. POSTERIOR CIRCULATION: Bilateral vertebral arteries are patent to the vertebrobasilar junction. No flow limiting stenosis or luminal irregularity. Source images and MIP images were reviewed. IMPRESSION: MRI HEAD: Mild motion degraded examination. Acute LEFT thalamus lacunar infarct versus motion artifact. 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 MRA HEAD: No emergent large vessel occlusion on this mildly motion degraded examination. Cerebral artery irregularity with moderate tandem stenoses PCA's can be seen with atherosclerosis or, due to motion artifact. MRA NECK: No hemodynamically significant stenosis or acute vascular process on this motion degraded examination. Electronically  Signed   By: Elon Alas M.D.   On: 02/15/2017 19:41   Ct Head Code Stroke W/o Cm  Result Date: 02/17/2017 CLINICAL DATA:  Code stroke. Right-sided weakness with slurred speech. EXAM: CT HEAD WITHOUT CONTRAST TECHNIQUE: Contiguous axial images were obtained from the base of the skull through the vertex without intravenous contrast. COMPARISON:  02/15/2017 FINDINGS: Brain: Low-density in the posterior limb left internal capsule correlating with previous MRI. No evidence of new infarct, hemorrhage, or hydrocephalus. Cavum velum interpositum, incidental. Vascular: Atherosclerotic calcification.  No hyperdense vessel. Skull: No acute finding Sinuses/Orbits: No acute finding Other: These results were called by telephone at the time of interpretation on 02/17/2017 at 10:37 am to  Dr. Lavonia Drafts , who verbally acknowledged these results. ASPECTS [facility] Stroke Program Early CT Score) - Ganglionic level infarction (caudate, lentiform nuclei, internal capsule, insula, M1-M3 cortex): 7 - Supraganglionic infarction (M4-M6 cortex): 3 Total score (0-10 with 10 being normal): 10 IMPRESSION: 1. No acute finding when compared to CT 2 days prior. 2. Left posterior limb internal capsule/lateral thalamus infarct by MRI 2 days prior. Electronically Signed   By: Monte Fantasia M.D.   On: 02/17/2017 10:40   Ct Head Code Stroke W/o Cm  Result Date: 02/15/2017 CLINICAL DATA:  Code stroke. Bilateral leg numbness, RIGHT-sided weakness while baking a cake 1 hour ago. History of stroke, hypertension, hyperlipidemia and diabetes. EXAM: CT HEAD WITHOUT CONTRAST TECHNIQUE: Contiguous axial images were obtained from the base of the skull through the vertex without intravenous contrast. COMPARISON:  MRI of the head November 27, 2016 FINDINGS: BRAIN: No intraparenchymal hemorrhage, mass effect nor midline shift. The ventricles and sulci are normal for age, cavum velum interpositum. Patchy supratentorial white matter hypodensities  within normal range for patient's age, though non-specific are most compatible with chronic small vessel ischemic disease. Patient's old small RIGHT frontal infarct not apparent by CT. No acute large vascular territory infarcts. No abnormal extra-axial fluid collections. Basal cisterns are patent. VASCULAR: Mild calcific atherosclerosis of the carotid siphons. SKULL: No skull fracture. No significant scalp soft tissue swelling. SINUSES/ORBITS: The mastoid air-cells and included paranasal sinuses are well-aerated.The included ocular globes and orbital contents are non-suspicious. OTHER: None. ASPECTS [facility] Stroke Program Early CT Score) - Ganglionic level infarction (caudate, lentiform nuclei, internal capsule, insula, M1-M3 cortex): 7 - Supraganglionic infarction (M4-M6 cortex): 3 Total score (0-10 with 10 being normal): 10 IMPRESSION: 1. No acute intracranial process. Mild chronic small vessel ischemic disease. 2. ASPECTS is 10. Critical Value/emergent results were called by telephone at the time of interpretation on 02/15/2017 at 5:17 pm to Dr. Rudene Re , who verbally acknowledged these results. Electronically Signed   By: Elon Alas M.D.   On: 02/15/2017 17:23    EKG:   Orders placed or performed during the hospital encounter of 02/17/17  . ED EKG  . ED EKG  . EKG 12-Lead  . EKG 12-Lead    IMPRESSION AND PLAN:  Kimberly Duarte  is a 76 y.o. female with a known history of Diabetes mellitus, GERD, hyperlipidemia, hypertension was just admitted to Hospital on May 19 due to a left thalamic infarct and right sided weakness and numbness and was discharged on May 20 with baby aspirin and Plavix. Today while she was on her way to see her cardiologist she has developed right upper extremity heaviness and numbness which is new.  #Recent left thalamic infarct with worsening of right lower extremity weakness and new onset right upper extremity heaviness with dysarthria   admit patient to  MedSurg unit   patient's symptoms could be related to the new infarct in the left thalamus 2 days ago  We'll get bedside swallow evaluation , patient will be nothing by mouth until then   PT, OT, speech therapy consult CT angiogram of the head and neck and MRI of the brain Neuro checks and telemetry monitoring Follow-up with neurology Will resume baby aspirin and Plavix after patient passes bedside swallow eval   #Diabetes mellitus patient is nothing by mouth provide her sliding scale insulin  #Hyperlipidemia resume statin after patient passes bedside swallow eval  #Hypertension will allow permissive hypertension   #GERD-PPI after passing bedside swallow eval  All the records are reviewed and case discussed with ED provider. Management plans discussed with the patient, family and they are in agreement.  CODE STATUS: fc ,2 daughters HCPOA  TOTAL TIME TAKING CARE OF THIS PATIENT: 45 minutes.   Note: This dictation was prepared with Dragon dictation along with smaller phrase technology. Any transcriptional errors that result from this process are unintentional.  Nicholes Mango M.D on 02/17/2017 at 12:38 PM  Between 7am to 6pm - Pager - [phone]  After 6pm go to [email or web address] - password [email or web address] [facility]  Office  [phone]  CC: Primary care physician; Tracie Harrier, MD

## 2017-02-17 NOTE — Progress Notes (Signed)
Patient transported to [place].  Clarise Cruz, RN

## 2017-02-17 NOTE — ED Provider Notes (Signed)
[facility] Emergency Department Provider Note   ____________________________________________    I have reviewed the triage vital signs and the nursing notes.   HISTORY  Chief Complaint Numbness    HPI Kimberly Duarte is a 76 y.o. female who presents with complaints of numbness. Patient reports right facial right arm and right leg numbness, she believes this developed at approximately 9 AM. Recently in the hospital for CVA, the symptoms are new. Denies muscle weakness. Daughter reports the patient has slurred speech and is unsteady. Code stroke called in triage   Past Medical History:  Diagnosis Date  . Depression   . Diabetes mellitus without complication ([place])   . Diabetic peripheral neuropathy ([place])   . GERD (gastroesophageal reflux disease)   . Hyperlipidemia   . Hypertension   . Obesity   . Stroke ([place])   . Uterine cancer [facility])     Patient Active Problem List   Diagnosis Date Noted  . CVA (cerebral vascular accident) ([place]) 11/27/2016    Past Surgical History:  Procedure Laterality Date  . ABDOMINAL HYSTERECTOMY    . COLONOSCOPY    . COLONOSCOPY WITH PROPOFOL N/A 09/04/2015   Procedure: COLONOSCOPY WITH PROPOFOL;  Surgeon: Manya Silvas, MD;  Location: [facility] ENDOSCOPY;  Service: Endoscopy;  Laterality: N/A;  . ESOPHAGOGASTRODUODENOSCOPY (EGD) WITH PROPOFOL N/A 09/04/2015   Procedure: ESOPHAGOGASTRODUODENOSCOPY (EGD) WITH PROPOFOL;  Surgeon: Manya Silvas, MD;  Location: [facility] ENDOSCOPY;  Service: Endoscopy;  Laterality: N/A;  . UPPER GI ENDOSCOPY      Prior to Admission medications   Medication Sig Start Date End Date Taking? Authorizing Provider  aspirin 81 MG chewable tablet Chew 1 tablet (81 mg total) by mouth daily. 02/16/17  Yes Bettey Costa, MD  atorvastatin (LIPITOR) 40 MG tablet Take 1 tablet (40 mg total) by mouth daily at 6 PM. 11/27/16  Yes Sudini, Srikar, MD  citalopram (CELEXA) 20 MG tablet Take 20 mg by mouth daily.    Yes [provider]  clopidogrel (PLAVIX) 75 MG tablet Take 1 tablet (75 mg total) by mouth daily. 02/16/17  Yes Mody, Ulice Bold, MD  Exenatide ER 2 MG/0.85ML AUIJ Inject 2 mg into the skin once a week.   Yes [provider]  glipiZIDE (GLUCOTROL) 10 MG tablet Take 10 mg by mouth 2 (two) times daily.    Yes [provider]  hydrochlorothiazide (MICROZIDE) 12.5 MG capsule Take 12.5 mg by mouth daily.   Yes [provider]  insulin NPH Human (HUMULIN N,NOVOLIN N) 100 UNIT/ML injection Inject 15-30 Units into the skin 3 (three) times daily. tk 30 units qam and 30 units qhs, and if sugar is high during the afternoon, tk an additional 15 units   Yes [provider]  lisinopril (PRINIVIL,ZESTRIL) 20 MG tablet Take 20 mg by mouth daily.   Yes [provider]  meloxicam (MOBIC) 15 MG tablet Take 15 mg by mouth daily.   Yes [provider]  metFORMIN (GLUCOPHAGE) 1000 MG tablet Take 1,000 mg by mouth 2 (two) times daily with a meal.   Yes [provider]  omeprazole (PRILOSEC) 20 MG capsule Take 20 mg by mouth daily as needed.    Yes [provider]  pregabalin (LYRICA) 225 MG capsule Take 225 mg by mouth 2 (two) times daily.   Yes [provider]  rOPINIRole (REQUIP) 0.5 MG tablet Take 0.5 mg by mouth every evening.   Yes [provider]  Vitamin D, Ergocalciferol, (DRISDOL)  50000 UNITS CAPS capsule Take 50,000 Units by mouth every 7 (seven) days.   Yes [provider]     Allergies Actos [pioglitazone]; Byetta 10 mcg pen [exenatide]; and Victoza [liraglutide]  Family History  Problem Relation Age of Onset  . Breast cancer Sister 74  . Colon cancer Mother   . Heart disease Father     Social History Social History  Substance Use Topics  . Smoking status: Never Smoker  . Smokeless tobacco: Never Used  . Alcohol use No    Review of Systems  Constitutional: No fever/chills Eyes: No visual  changes.  ENT: No neck pain Cardiovascular: No dizziness  Neurological: Negative for headaches or weakness   ____________________________________________   PHYSICAL EXAM:  VITAL SIGNS: ED Triage Vitals  Enc Vitals Group     BP 02/17/17 1025 (!) 154/82     Pulse Rate 02/17/17 1025 73     Resp 02/17/17 1025 18     Temp 02/17/17 1025 98 F (36.7 C)     Temp Source 02/17/17 1025 Oral     SpO2 02/17/17 1025 96 %     Weight 02/17/17 1025 88.9 kg (196 lb)     Height 02/17/17 1025 1.6 m (5\' 3" )     Head Circumference --      Peak Flow --      Pain Score 02/17/17 1024 0     Pain Loc --      Pain Edu? --      Excl. in [place]? --     Constitutional: Alert and oriented. No acute distress. Pleasant and interactive Eyes: Conjunctivae are normal.  Head: Atraumatic. Nose: No congestion/rhinnorhea. Mouth/Throat: Mucous membranes are moist.     Respiratory: Normal respiratory effort.  No retractions.   Genitourinary: deferred Musculoskeletal: Moves all extremities. Warm and well perfused Neurologic:  Normal speech and language. No gross focal neurologic deficits are appreciated.  Skin:  Skin is warm, dry and intact. No rash noted. Psychiatric: Mood and affect are normal. Speech and behavior are normal.  ____________________________________________   LABS (all labs ordered are listed, but only abnormal results are displayed)  Labs Reviewed  COMPREHENSIVE METABOLIC PANEL - Abnormal; Notable for the following:       Result Value   Glucose, Bld 172 (*)    All other components within normal limits  GLUCOSE, CAPILLARY - Abnormal; Notable for the following:    Glucose-Capillary 166 (*)    All other components within normal limits  PROTIME-INR  APTT  CBC  DIFFERENTIAL  TROPONIN I  CBG MONITORING, ED   ____________________________________________  EKG  ED ECG REPORT I, Lavonia Drafts, the attending physician, personally viewed and interpreted this ECG.  Date:  02/17/2017  Rate: 72 Rhythm: normal sinus rhythm QRS Axis: normal Intervals: normal ST/T Wave abnormalities: normal Conduction Disturbances: none Narrative Interpretation: unremarkable  ____________________________________________  RADIOLOGY  CT head no acute distress ____________________________________________   PROCEDURES  Procedure(s) performed: No    Critical Care performed: No ____________________________________________   INITIAL IMPRESSION / ASSESSMENT AND PLAN / ED COURSE  Pertinent labs & imaging results that were available during my care of the patient were reviewed by me and considered in my medical decision making (see chart for details).  Code stroke called in triage and patient seen rapidly by Dr. Doy Mince of neurology. NIH stroke scale 4 but recent infarct so TPA contraindicated per Dr. Doy Mince. She recommends admission for repeat MRI and CT angio    ____________________________________________   FINAL CLINICAL  IMPRESSION(S) / ED DIAGNOSES  Final diagnoses:  Cerebrovascular accident (CVA), unspecified mechanism ([place])      NEW MEDICATIONS STARTED DURING THIS VISIT:  New Prescriptions   No medications on file     Note:  This document was prepared using Dragon voice recognition software and may include unintentional dictation errors.    Lavonia Drafts, MD 02/17/17 1233

## 2017-02-18 DIAGNOSIS — I639 Cerebral infarction, unspecified: Secondary | ICD-10-CM | POA: Diagnosis not present

## 2017-02-18 DIAGNOSIS — R531 Weakness: Secondary | ICD-10-CM

## 2017-02-18 LAB — GLUCOSE, CAPILLARY
GLUCOSE-CAPILLARY: 149 mg/dL — AB (ref 65–99)
GLUCOSE-CAPILLARY: 100 mg/dL — AB (ref 65–99)
Glucose-Capillary: 138 mg/dL — ABNORMAL HIGH (ref 65–99)
Glucose-Capillary: 131 mg/dL — ABNORMAL HIGH (ref 65–99)

## 2017-02-18 NOTE — Progress Notes (Signed)
Subjective: No new neurological complaints.    Objective: Current vital signs: BP 140/72 (BP Location: Right Arm)   Pulse 81   Temp 98.7 F (37.1 C) (Oral)   Resp 20   Ht 5\' 3"  (1.6 m)   Wt 88.5 kg (195 lb)   SpO2 92%   BMI 34.54 kg/m  Vital signs in last 24 hours: Temp:  [97.5 F (36.4 C)-98.7 F (37.1 C)] 98.7 F (37.1 C) (05/22 0745) Pulse Rate:  [66-81] 81 (05/22 0745) Resp:  [14-20] 20 (05/22 0745) BP: (116-191)/(55-92) 140/72 (05/22 0745) SpO2:  [89 %-99 %] 92 % (05/22 0745) Weight:  [88.5 kg (195 lb)] 88.5 kg (195 lb) (05/21 1414)  Intake/Output from previous day: 05/21 0701 - 05/22 0700 In: 240 [P.O.:240] Out: 800 [Urine:800] Intake/Output this shift: Total I/O In: 480 [P.O.:480] Out: -  Nutritional status: Diet heart healthy/carb modified Room service appropriate? Yes; Fluid consistency: Thin Diet - low sodium heart healthy  Neurologic Exam: Mental Status: Alert, oriented, thought content appropriate.  Speech fluent without evidence of aphasia with some mild slurring.  Able to follow 3 step commands without difficulty. Cranial Nerves: II: Discs flat bilaterally; Visual fields grossly normal, pupils equal, round, reactive to light and accommodation III,IV, VI: ptosis not present, extra-ocular motions intact bilaterally V,VII: decreased right NLF, facial light touch sensation decreased on the right VIII: hearing normal bilaterally IX,X: gag reflex present XI: bilateral shoulder shrug XII: midline tongue extension Motor: Right :  Upper extremity   5-/5                                     Left:     Upper extremity   5/5             Lower extremity   5-/5                                                 Lower extremity   5/5 Tone and bulk:normal tone throughout; no atrophy noted Sensory: Pinprick and light touch decreased on the right upper and lower extremities    Lab Results: Basic Metabolic Panel:  Recent Labs Lab 02/15/17 1654 02/17/17 1037  NA 139  140  K 3.7 3.8  CL 104 105  CO2 23 24  GLUCOSE 211* 172*  BUN 16 12  CREATININE 0.87 0.86  CALCIUM 9.4 9.5    Liver Function Tests:  Recent Labs Lab 02/15/17 1654 02/17/17 1037  AST 21 26  ALT 18 18  ALKPHOS 65 63  BILITOT 0.6 0.7  PROT 7.3 7.5  ALBUMIN 4.0 4.2   No results for input(s): LIPASE, AMYLASE in the last 168 hours. No results for input(s): AMMONIA in the last 168 hours.  CBC:  Recent Labs Lab 02/15/17 1654 02/17/17 1037  WBC 9.4 8.7  NEUTROABS 5.5 5.0  HGB 14.0 14.6  HCT 41.1 43.0  MCV 86.5 86.3  PLT 199 207    Cardiac Enzymes:  Recent Labs Lab 02/15/17 1654 02/17/17 1037  TROPONINI <0.03 <0.03    Lipid Panel:  Recent Labs Lab 02/16/17 0456  CHOL 144  TRIG 213*  HDL 28*  CHOLHDL 5.1  VLDL 43*  LDLCALC 73    CBG:  Recent Labs Lab 02/17/17 1400 02/17/17 1621 02/17/17 2017 02/18/17  8413 02/18/17 1142  GLUCAP 118* 102* 167* 138* 149*    Microbiology: No results found for this or any previous visit.  Coagulation Studies:  Recent Labs  02/15/17 1654 02/17/17 1037  LABPROT 12.5 12.6  INR 0.93 0.94    Imaging: Ct Angio Head W/cm &/or Wo Cm  Result Date: 02/17/2017 CLINICAL DATA:  76 y/o F; recent left thalamus/posterior limb of internal capsule infarction. Interval development of right upper extremity heaviness and numbness which is new as well as right lower extremity weakness and slurred speech. EXAM: CT ANGIOGRAPHY HEAD AND NECK TECHNIQUE: Multidetector CT imaging of the head and neck was performed using the standard protocol during bolus administration of intravenous contrast. Multiplanar CT image reconstructions and MIPs were obtained to evaluate the vascular anatomy. Carotid stenosis measurements (when applicable) are obtained utilizing NASCET criteria, using the distal internal carotid diameter as the denominator. CONTRAST:  75 cc Isovue 370 COMPARISON:  02/15/2017 MRI of the head.  02/17/2017 CT of the head.  FINDINGS: CTA NECK FINDINGS Aortic arch: Bovine arch, normal variant. Mild calcific atherosclerosis. No aneurysm. Right carotid system: No evidence of dissection, stenosis (50% or greater) or occlusion.Mild predominantly fibrofatty plaque of the bifurcation without significant stenosis. Left carotid system: No evidence of dissection, stenosis (50% or greater) or occlusion. Left carotid bifurcation moderate predominantly calcified plaque with mild 30-40% proximal ICA stenosis. Vertebral arteries: Right dominant. No evidence of dissection, stenosis (50% or greater) or occlusion. Skeleton: Moderate cervical spondylosis with discogenic degenerative changes greatest at the C5-6 level, left-sided upper cervical and right-sided mid cervical facet arthrosis, and slight reversal of curvature at the C5-6 level. Other neck: Fatty replacement of the parotid glands bilaterally compatible with sequelae of chronic parotiditis. Thyroid nodules measuring up to 6 mm in right lobe of thyroid. Upper chest: Negative. Review of the MIP images confirms the above findings CTA HEAD FINDINGS Anterior circulation: No significant stenosis, proximal occlusion, aneurysm, or vascular malformation. Mild non stenotic calcific atherosclerosis of cavernous and paraclinoid internal carotid arteries. Posterior circulation: Left V4 short segment of mild-to-moderate stenosis and right V4 small calcified plaque with mild stenosis. The basilar short segment of mild stenosis. Mild irregularity of bilateral P2 with segments of mild right greater than left stenosis. No large vessel occlusion, aneurysm, or high-grade stenosis is identified. Venous sinuses: As permitted by contrast timing, patent. Anatomic variants: Patent anterior and posterior communicating arteries. Large left posterior communicating artery with small P1 segment compatible with persistent fetal circulation. Delayed phase: No abnormal intracranial enhancement. Review of the MIP images  confirms the above findings IMPRESSION: 1. Patent carotid and vertebral arteries. No large vessel occlusion, aneurysm, or high-grade stenosis is identified. 2. Left carotid bifurcation calcified plaque with mild 30-40% proximal left ICA stenosis. 3. Patent circle of Willis. No large vessel occlusion, aneurysm, or high-grade stenosis is identified. 4. Intracranial atherosclerosis greatest in the posterior circulation with there are multiple areas of mild-to-moderate stenosis. Electronically Signed   By: Kristine Garbe M.D.   On: 02/17/2017 15:26   Dg Chest 2 View  Result Date: 02/17/2017 CLINICAL DATA:  Stroke.  CED EXAM: CHEST  2 VIEW COMPARISON:  11/27/2015 FINDINGS: There is increased rightward patient rotation compared to the prior study which limits assessment of the mediastinal contours. The cardiac silhouette appears mildly enlarged, accentuated by patient rotation and AP technique. No airspace consolidation, edema, pleural effusion, or pneumothorax is identified. No acute osseous abnormality is seen. IMPRESSION: No active cardiopulmonary disease. Electronically Signed   By: Logan Bores  M.D.   On: 02/17/2017 15:35   Ct Angio Neck W/cm &/or Wo/cm  Result Date: 02/17/2017 CLINICAL DATA:  76 y/o F; recent left thalamus/posterior limb of internal capsule infarction. Interval development of right upper extremity heaviness and numbness which is new as well as right lower extremity weakness and slurred speech. EXAM: CT ANGIOGRAPHY HEAD AND NECK TECHNIQUE: Multidetector CT imaging of the head and neck was performed using the standard protocol during bolus administration of intravenous contrast. Multiplanar CT image reconstructions and MIPs were obtained to evaluate the vascular anatomy. Carotid stenosis measurements (when applicable) are obtained utilizing NASCET criteria, using the distal internal carotid diameter as the denominator. CONTRAST:  75 cc Isovue 370 COMPARISON:  02/15/2017 MRI of the  head.  02/17/2017 CT of the head. FINDINGS: CTA NECK FINDINGS Aortic arch: Bovine arch, normal variant. Mild calcific atherosclerosis. No aneurysm. Right carotid system: No evidence of dissection, stenosis (50% or greater) or occlusion.Mild predominantly fibrofatty plaque of the bifurcation without significant stenosis. Left carotid system: No evidence of dissection, stenosis (50% or greater) or occlusion. Left carotid bifurcation moderate predominantly calcified plaque with mild 30-40% proximal ICA stenosis. Vertebral arteries: Right dominant. No evidence of dissection, stenosis (50% or greater) or occlusion. Skeleton: Moderate cervical spondylosis with discogenic degenerative changes greatest at the C5-6 level, left-sided upper cervical and right-sided mid cervical facet arthrosis, and slight reversal of curvature at the C5-6 level. Other neck: Fatty replacement of the parotid glands bilaterally compatible with sequelae of chronic parotiditis. Thyroid nodules measuring up to 6 mm in right lobe of thyroid. Upper chest: Negative. Review of the MIP images confirms the above findings CTA HEAD FINDINGS Anterior circulation: No significant stenosis, proximal occlusion, aneurysm, or vascular malformation. Mild non stenotic calcific atherosclerosis of cavernous and paraclinoid internal carotid arteries. Posterior circulation: Left V4 short segment of mild-to-moderate stenosis and right V4 small calcified plaque with mild stenosis. The basilar short segment of mild stenosis. Mild irregularity of bilateral P2 with segments of mild right greater than left stenosis. No large vessel occlusion, aneurysm, or high-grade stenosis is identified. Venous sinuses: As permitted by contrast timing, patent. Anatomic variants: Patent anterior and posterior communicating arteries. Large left posterior communicating artery with small P1 segment compatible with persistent fetal circulation. Delayed phase: No abnormal intracranial  enhancement. Review of the MIP images confirms the above findings IMPRESSION: 1. Patent carotid and vertebral arteries. No large vessel occlusion, aneurysm, or high-grade stenosis is identified. 2. Left carotid bifurcation calcified plaque with mild 30-40% proximal left ICA stenosis. 3. Patent circle of Willis. No large vessel occlusion, aneurysm, or high-grade stenosis is identified. 4. Intracranial atherosclerosis greatest in the posterior circulation with there are multiple areas of mild-to-moderate stenosis. Electronically Signed   By: Kristine Garbe M.D.   On: 02/17/2017 15:26   Mr Brain Wo Contrast  Result Date: 02/17/2017 CLINICAL DATA:  76 y/o F; left thalamic infarct with worsening right-sided weakness. EXAM: MRI HEAD WITHOUT CONTRAST TECHNIQUE: Multiplanar, multiecho pulse sequences of the brain and surrounding structures were obtained without intravenous contrast. COMPARISON:  02/15/2017 MRI of the head. FINDINGS: Brain: Focus of reduced diffusion within the left posterior limb of internal capsule demonstrates a more pronounced signal abnormality but not measurably different size in comparison with the prior MRI of the head (series 5, image 24). Additionally, T2 FLAIR hyperintense signal abnormality associated with the infarct is mildly increased. Several stable nonspecific foci of T2 FLAIR hyperintense signal abnormality are present in subcortical and periventricular white matter compatible with mild chronic  microvascular ischemic changes. There is mild diffuse brain parenchymal volume loss. No abnormal susceptibility hypointensity to indicate intracranial hemorrhage. No focal mass effect. Cavum velum interpositum. Vascular: Normal flow voids. Skull and upper cervical spine: Normal marrow signal. Sinuses/Orbits: Negative. Other: None. IMPRESSION: 1. Acute infarction within left posterior limb of internal capsule demonstrates more pronounced signal abnormality without measurably different  size compared with prior MRI of the brain. Findings likely represent slight progression of the prior infarction. No new acute infarction or hemorrhage identified. 2. Stable background of mild chronic microvascular ischemic changes and mild parenchymal volume loss of the brain. Electronically Signed   By: Kristine Garbe M.D.   On: 02/17/2017 16:07   US Carotid Bilateral (at [facility] And [facility] Only)  Result Date: 02/17/2017 CLINICAL DATA:  CVA.  Bilateral leg weakness. EXAM: BILATERAL CAROTID DUPLEX ULTRASOUND TECHNIQUE: Pearline Cables scale imaging, color Doppler and duplex ultrasound were performed of bilateral carotid and vertebral arteries in the neck. COMPARISON:  None. FINDINGS: Criteria: Quantification of carotid stenosis is based on velocity parameters that correlate the residual internal carotid diameter with NASCET-based stenosis levels, using the diameter of the distal internal carotid lumen as the denominator for stenosis measurement. The following velocity measurements were obtained: RIGHT ICA:  116 cm/sec CCA:  69 cm/sec SYSTOLIC ICA/CCA RATIO:  1.6 DIASTOLIC ICA/CCA RATIO:  2.1 ECA:  107 cm/sec LEFT ICA:  92 cm/sec CCA:  83 cm/sec SYSTOLIC ICA/CCA RATIO:  1.1 DIASTOLIC ICA/CCA RATIO:  1.9 ECA:  99 cm/sec RIGHT CAROTID ARTERY: Mild calcified plaque in the bulb. Low resistance internal carotid Doppler pattern. RIGHT VERTEBRAL ARTERY:  Antegrade. LEFT CAROTID ARTERY: Mild calcified plaque in the bulb. Low resistance internal carotid Doppler pattern. LEFT VERTEBRAL ARTERY:  Antegrade. IMPRESSION: Less than 50% stenosis in the right and left internal carotid arteries. Electronically Signed   By: Marybelle Killings M.D.   On: 02/17/2017 15:29   Ct Head Code Stroke W/o Cm  Result Date: 02/17/2017 CLINICAL DATA:  Code stroke. Right-sided weakness with slurred speech. EXAM: CT HEAD WITHOUT CONTRAST TECHNIQUE: Contiguous axial images were obtained from the base of the skull through the vertex without intravenous  contrast. COMPARISON:  02/15/2017 FINDINGS: Brain: Low-density in the posterior limb left internal capsule correlating with previous MRI. No evidence of new infarct, hemorrhage, or hydrocephalus. Cavum velum interpositum, incidental. Vascular: Atherosclerotic calcification.  No hyperdense vessel. Skull: No acute finding Sinuses/Orbits: No acute finding Other: These results were called by telephone at the time of interpretation on 02/17/2017 at 10:37 am to Dr. Lavonia Drafts , who verbally acknowledged these results. ASPECTS [facility] Stroke Program Early CT Score) - Ganglionic level infarction (caudate, lentiform nuclei, internal capsule, insula, M1-M3 cortex): 7 - Supraganglionic infarction (M4-M6 cortex): 3 Total score (0-10 with 10 being normal): 10 IMPRESSION: 1. No acute finding when compared to CT 2 days prior. 2. Left posterior limb internal capsule/lateral thalamus infarct by MRI 2 days prior. Electronically Signed   By: Monte Fantasia M.D.   On: 02/17/2017 10:40    Medications:  I have reviewed the patient's current medications. Scheduled: . aspirin  81 mg Oral Daily  . atorvastatin  40 mg Oral q1800  . citalopram  20 mg Oral Daily  . clopidogrel  75 mg Oral Daily  . enoxaparin (LOVENOX) injection  40 mg Subcutaneous Q24H  . glipiZIDE  10 mg Oral BID AC  . hydrochlorothiazide  12.5 mg Oral Daily  . insulin aspart  0-5 Units Subcutaneous QHS  . insulin aspart  0-9 Units  Subcutaneous TID WC  . insulin aspart protamine- aspart  30 Units Subcutaneous BID AC & HS  . lisinopril  20 mg Oral Daily  . metFORMIN  1,000 mg Oral BID WC  . pantoprazole  40 mg Oral Daily  . pregabalin  225 mg Oral BID  . rOPINIRole  0.5 mg Oral QPM  . Vitamin D (Ergocalciferol)  50,000 Units Oral Q7 days    Assessment/Plan: No new neurological complaints.  MRI of the brain reviewed.  There is no change in size of the left IC infarct.  No additional acute infarcts noted either.  CTA shows mild to moderate posterior  circulation atherosclerosis.  Therapy evaluating patient.  Recommendations: 1.  Continue ASA and Plavix   LOS: 0 days   Alexis Goodell, MD Neurology [phone] 02/18/2017  12:18 PM

## 2017-02-18 NOTE — Evaluation (Signed)
Occupational Therapy Evaluation Patient Details Name: Kimberly Duarte MRN: [id number] DOB: June 18, 1941 Today's Date: 02/18/2017    History of Present Illness 76yo female pt with recent admission to ED for L thalamic infarct on 5/19 discharged 5/20; back to ED on 5/21 with R side weakness/numbness. PMHx includes HTN, DM, GERD, peripheral neurology, past CVA, uterine cancer, and depression.  MRI indicates no change in size of the left IC infarct and no additional acute infarcts.     Clinical Impression   Pt seen for OT evaluation this date. Pt presents with impaired R sided coordination/strength/balance and increased falls risk. Pt performed bed mobility with modified independence, had 1 slight LOB during functional mobility but able to self correct with min guard- min assist from OT. Pt reports RUE/LE feel "heavy" but denied any decreased sensation/numbness/tingling.  Pt has full AROM of BUEs and hands but R shoulder flexion is slightly weaker than L.  Coordination is intact but slow and requires extra time. Pt will benefit from skilled OT services while in hospital and on an OP basis to continue to work on increasing independence in ADLs, balance and functional mobility training, fine motor coordination exercises, and family ed and training.    Follow Up Recommendations  Outpatient OT    Equipment Recommendations  None recommended by OT    Recommendations for Other Services       Precautions / Restrictions Precautions Precautions: Fall Restrictions Weight Bearing Restrictions: No      Mobility Bed Mobility Overal bed mobility: Modified Independent             General bed mobility comments: HOB elevated slightly to mimic home set up, additional time/effort needed but no physical assist required to perform sup>sit EOB.  Transfers Overall transfer level: Needs assistance Equipment used: Standard walker Transfers: Sit to/from Stand Sit to Stand: Min guard         General  transfer comment: verbal cues for hand placement    Balance Overall balance assessment: Needs assistance Sitting-balance support: No upper extremity supported;Feet supported Sitting balance-Leahy Scale: Good     Standing balance support: Bilateral upper extremity supported;During functional activity Standing balance-Leahy Scale: Fair Standing balance comment: during dynamic standing and while ambulating with std walker to bathroom, pt had slight LOB to the R, able to help self correct with min guard-min assist                           ADL either performed or assessed with clinical judgement   ADL Overall ADL's : Needs assistance/impaired     Grooming: Standing;Min guard;Wash/dry hands   Upper Body Bathing: Sitting;Set up   Lower Body Bathing: Sit to/from stand;Sitting/lateral leans;Set up;Min guard   Upper Body Dressing : Sitting;Set up   Lower Body Dressing: Min guard;Sit to/from stand   Toilet Transfer: Geophysical data processor Details (indicate cue type and reason): verbal cues for hand placement to maximize safety Toileting- Clothing Manipulation and Hygiene: Min guard;Sit to/from stand       Functional mobility during ADLs: Rolling walker;Min guard General ADL Comments: pt generally min guard for LB ADL     Vision Baseline Vision/History: Wears glasses Wears Glasses: At all times (reports she doesn't always wear them) Patient Visual Report: No change from baseline Vision Assessment?: No apparent visual deficits     Perception     Praxis      Pertinent Vitals/Pain Pain Assessment: No/denies pain  Hand Dominance Right   Extremity/Trunk Assessment Upper Extremity Assessment Upper Extremity Assessment: RUE deficits/detail RUE Deficits / Details: R shoulder flexion 4+/5, all else 5/5 bilaterally; intact sensation with testing, pt states R side feels "heavy"; decreased hand eye coordination, RUE arm drift with eyes  closed, decreased speed/coordination with finger to thumb opposition RUE Sensation: decreased proprioception RUE Coordination: decreased fine motor   Lower Extremity Assessment Lower Extremity Assessment: Defer to PT evaluation;RLE deficits/detail RLE Deficits / Details: feels "heavy", grossly 4+/5   Cervical / Trunk Assessment Cervical / Trunk Assessment: Normal   Communication Communication Communication: No difficulties   Cognition Arousal/Alertness: Awake/alert Behavior During Therapy: WFL for tasks assessed/performed Overall Cognitive Status: Within Functional Limits for tasks assessed                                     General Comments       Exercises     Shoulder Instructions      Home Living Family/patient expects to be discharged to:: Private residence Living Arrangements: Alone Available Help at Discharge: Family;Available PRN/intermittently (family next door, would likely be home alone for small short periods of time during the day) Type of Home: Other(Comment) (modular home) Home Access: Stairs to enter Entrance Stairs-Number of Steps: 1 Entrance Stairs-Rails:  (has columns on both sides to hold on to) Home Layout: One level     Bathroom Shower/Tub: Walk-in shower;Door   [employer] Toilet: Standard Bathroom Accessibility: Yes How Accessible: Accessible via walker ("it's doable") Home Equipment: Walker - standard   Additional Comments: daughter just purchased a standard walker for pt very recently       Prior Functioning/Environment Level of Independence: Independent        Comments: prior to stroke from 5/19 admission, pt was independent with ADL, IADL including driving, med mgt, and no falls in past 12 months        OT Problem List: Decreased coordination;Decreased strength;Decreased safety awareness;Impaired balance (sitting and/or standing)      OT Treatment/Interventions: Self-care/ADL training;Therapeutic  exercise;Neuromuscular education;Energy conservation;Balance training;Therapeutic activities;Patient/family education;DME and/or AE instruction    OT Goals(Current goals can be found in the care plan section) Acute Rehab OT Goals Patient Stated Goal: go home OT Goal Formulation: With patient/family Time For Goal Achievement: 03/04/17 Potential to Achieve Goals: Good  OT Frequency: Min 2X/week   Barriers to D/C:            Co-evaluation              AM-PAC PT "6 Clicks" Daily Activity     Outcome Measure Help from another person eating meals?: None Help from another person taking care of personal grooming?: None Help from another person toileting, which includes using toliet, bedpan, or urinal?: A Little Help from another person bathing (including washing, rinsing, drying)?: A Little Help from another person to put on and taking off regular upper body clothing?: None Help from another person to put on and taking off regular lower body clothing?: A Little 6 Click Score: 21   End of Session Equipment Utilized During Treatment: Gait belt;Other (comment) (standard walker)  Activity Tolerance: Patient tolerated treatment well Patient left: in bed;with call bell/phone within reach;with bed alarm set;with family/visitor present  OT Visit Diagnosis: Other abnormalities of gait and mobility (R26.89);Muscle weakness (generalized) (M62.81);Other symptoms and signs involving cognitive function  Time: 9379-0240 OT Time Calculation (min): 36 min Charges:  OT General Charges $OT Visit: 1 Procedure OT Evaluation $OT Eval Low Complexity: 1 Procedure OT Treatments $Self Care/Home Management : 23-37 mins G-Codes:     Jeni Salles, MPH, MS, OTR/L ascom [phone] 02/18/17, 12:57 PM

## 2017-02-18 NOTE — Care Management (Signed)
Admitted to [facility] under observation status with the diagnosis of CVA. Discharged from this facility 02/16/17.  Lives alone. Daughter is Harland German [phone]) and Alfred Levins. Last seen Dr. Ginette Pitman 3/15./18. Last seen Dr. Manuella Ghazi 02/17/17. Prescriptions are filled at [facility] in [place]. No home health, no skilled facility. No home oxygen. Rolling walker, if needed. Takes care of all basic and instrumental activities of daily living herself, drives. No falls. Good appetite. Family will transport. Shelbie Ammons RN MSN CCM Care Management [phone]

## 2017-02-18 NOTE — Evaluation (Signed)
Physical Therapy Evaluation Patient Details Name: Kimberly Duarte MRN: [id number] DOB: 12/09/1940 Today's Date: 02/18/2017   History of Present Illness  Pt is a 76 y.o. female pt with recent admission to ED for L thalamic infarct on 5/19 and discharged 5/20; back to ED on 5/21 with R side weakness/numbness. PMHx includes HTN, DM, GERD, peripheral neuropathy, prior CVA, uterine cancer, and depression.  MRI indicates no change in size of the left IC infarct and no additional acute infarcts.  Clinical Impression  Prior to recent hospital admissions, pt was independent with functional mobility.  Pt lives alone in 1 level home with 1 step to enter.  Pt reporting noticing increased balance difficulties yesterday and feeling more unsteady and weak this afternoon (compared to this morning) d/t fatigue.  Currently pt is CGA with transfers using RW (pt using wide BOS to improve balance) and CGA to min assist with ambulation 80 feet with RW. Pt unsteady in general requiring consistent CGA for safety with ambulation; min assist for balance d/t loss of balance (x3) to R with turning; pt c/o increasing R LE weakness with distance and pt demonstrating decreased cadence with distance.  Standing with wide BOS and no UE support, CGA for safety d/t increased lateral sway (R>L sway); pt had UE support on RW and placed B feet together (with eyes opened) and upon pt taking hands off walker pt immediately had loss of balance to R and required mod to max assist to prevent fall/maintain upright (pt unable to self correct with walker or by taking a step to correct).  Pt would benefit from skilled PT to address noted impairments and functional limitations (see below for any additional details).  Pt does not appear safe to discharge home alone d/t above noted balance impairments and risks for falling.  Upon hospital discharge, recommend pt discharge to STR to improve balance and safety with functional mobility.    Follow Up  Recommendations SNF    Equipment Recommendations  Rolling walker with 5" wheels    Recommendations for Other Services       Precautions / Restrictions Precautions Precautions: Fall Restrictions Weight Bearing Restrictions: No      Mobility  Bed Mobility Overal bed mobility: Modified Independent             General bed mobility comments: HOB mildly elevated; supine to/from sit with increased time/effort but no physical assist required  Transfers Overall transfer level: Needs assistance Equipment used: Rolling walker (2 wheeled) Transfers: Sit to/from Stand Sit to Stand: Min guard         General transfer comment: verbal cues for hand placement  Ambulation/Gait Ambulation/Gait assistance: Min guard;Min assist Ambulation Distance (Feet): 80 Feet Assistive device: Rolling walker (2 wheeled)   Gait velocity: decreased   General Gait Details: decreased B step length; decreased R LE stance time; unsteady in general requiring consistent CGA for safety; min assist for balance d/t loss of balance (x3) to R with turning; pt c/o increasing R LE weakness with distance and pt demonstrating decreased cadence with distance  Stairs Stairs:  (Deferred d/t pt c/o increasing R LE weakness with distance ambulated/activity)          Wheelchair Mobility    Modified Rankin (Stroke Patients Only)       Balance Overall balance assessment: Needs assistance Sitting-balance support: No upper extremity supported;Feet supported Sitting balance-Leahy Scale: Good Sitting balance - Comments: sitting reaching within BOS   Standing balance support: During functional activity;Bilateral upper extremity supported (  UE support on RW) Standing balance-Leahy Scale: Poor Standing balance comment: (per OT eval: during dynamic standing and while ambulating with std walker to bathroom, pt had slight LOB to the R, able to help self correct with min guard-min assist)               High  Level Balance Comments: Standing with wide BOS eyes open and closed CGA for safety d/t increased lateral sway (R>L sway), no UE support; pt had UE support on RW and placed B feet together and upon pt taking hands off walker pt immediately had loss of balance to R and required mod to max assist to prevent fall/maintain upright (pt unable to self correct with walker or by taking a step to correct)             Pertinent Vitals/Pain Pain Assessment: No/denies pain    Home Living Family/patient expects to be discharged to:: Private residence Living Arrangements: Alone Available Help at Discharge: Family;Available PRN/intermittently (family lives next door; family works during day) Type of Home: Other(Comment) (Modular home) Home Access: Stairs to enter Entrance Stairs-Rails: None Entrance Stairs-Number of Steps: 1 step with B columns to hold onto Home Layout: One level Home Equipment: Environmental consultant - standard Additional Comments: daughter just purchased a standard walker for pt very recently     Prior Function Level of Independence: Independent         Comments: Prior to stroke from 5/19 admission, pt was independent with ADL, IADL (including driving), med management, and no falls in past 12 months     Hand Dominance   Dominant Hand: Right    Extremity/Trunk Assessment   Upper Extremity Assessment Upper Extremity Assessment: Defer to OT evaluation RUE Deficits / Details: R shoulder flexion 4+/5, all else 5/5 bilaterally; intact sensation with testing, pt states R side feels "heavy"; decreased hand eye coordination, RUE arm drift with eyes closed, decreased speed/coordination with finger to thumb opposition RUE Sensation: decreased proprioception RUE Coordination: decreased fine motor    Lower Extremity Assessment Lower Extremity Assessment: RLE deficits/detail;LLE deficits/detail RLE Deficits / Details: reports feelings of "heaviness"; 4+/5 R hip flexion, R knee flexion/extension, R  DF (impaired R heel to shin coordination; decreased light touch R foot) RLE Coordination: decreased gross motor LLE Deficits / Details: hip flexion, knee flexion/extension, and DF 5/5    Cervical / Trunk Assessment Cervical / Trunk Assessment: Normal  Communication   Communication: No difficulties  Cognition Arousal/Alertness: Awake/alert Behavior During Therapy: WFL for tasks assessed/performed Overall Cognitive Status: Within Functional Limits for tasks assessed                                        General Comments   Nursing cleared pt for participation in physical therapy.  Pt agreeable to PT session.    Exercises     Assessment/Plan    PT Assessment Patient needs continued PT services  PT Problem List Decreased strength;Decreased activity tolerance;Decreased balance;Decreased mobility;Decreased knowledge of use of DME       PT Treatment Interventions DME instruction;Gait training;Stair training;Functional mobility training;Therapeutic activities;Therapeutic exercise;Balance training;Patient/family education;Neuromuscular re-education    PT Goals (Current goals can be found in the Care Plan section)  Acute Rehab PT Goals Patient Stated Goal: to improve her balance PT Goal Formulation: With patient Time For Goal Achievement: 03/04/17 Potential to Achieve Goals: Good    Frequency 7X/week   Barriers  to discharge Decreased caregiver support      Co-evaluation               AM-PAC PT "6 Clicks" Daily Activity  Outcome Measure Difficulty turning over in bed (including adjusting bedclothes, sheets and blankets)?: None Difficulty moving from lying on back to sitting on the side of the bed? : None Difficulty sitting down on and standing up from a chair with arms (e.g., wheelchair, bedside commode, etc,.)?: A Little Help needed moving to and from a bed to chair (including a wheelchair)?: A Little Help needed walking in hospital room?: A Little Help  needed climbing 3-5 steps with a railing? : A Lot 6 Click Score: 19    End of Session Equipment Utilized During Treatment: Gait belt Activity Tolerance: Patient limited by fatigue Patient left: in bed;with call bell/phone within reach;with bed alarm set Nurse Communication: Mobility status;Precautions PT Visit Diagnosis: Unsteadiness on feet (R26.81);Other abnormalities of gait and mobility (R26.89)    Time: 6734-1937 PT Time Calculation (min) (ACUTE ONLY): 21 min   Charges:   PT Evaluation $PT Eval Low Complexity: 1 Procedure     PT G CodesLeitha Bleak, PT 02/18/17, 2:20 PM [phone]

## 2017-02-18 NOTE — NC FL2 (Signed)
[place] LEVEL OF CARE SCREENING TOOL     IDENTIFICATION  Patient Name: Kimberly Duarte Birthdate: 12-03-1940 Sex: female Admission Date (Current Location): 02/17/2017  [place] and [place] Number:  Engineering geologist and Address:  [facility], [address], [place], [place] [address]      Provider Number: [id number]  Attending Physician Name and Address:  Demetrios Loll, MD  Relative Name and Phone Number:       Current Level of Care: Hospital Recommended Level of Care: [place] Prior Approval Number:    Date Approved/Denied:   PASRR Number:    Discharge Plan: SNF    Current Diagnoses: Patient Active Problem List   Diagnosis Date Noted  . CVA (cerebral vascular accident) ([place]) 11/27/2016    Orientation RESPIRATION BLADDER Height & Weight     Self, Time, Situation, Place  Normal Continent Weight: 195 lb (88.5 kg) Height:  5\' 3"  (160 cm)  BEHAVIORAL SYMPTOMS/MOOD NEUROLOGICAL BOWEL NUTRITION STATUS      Continent Diet (Heart Healthy/Carb Modified, Thin Liquids)  AMBULATORY STATUS COMMUNICATION OF NEEDS Skin   Limited Assist Verbally Normal                       Personal Care Assistance Level of Assistance  Bathing, Feeding, Dressing Bathing Assistance: Limited assistance Feeding assistance: Independent Dressing Assistance: Limited assistance     Functional Limitations Info  Sight, Hearing, Speech Sight Info: Adequate Hearing Info: Adequate Speech Info: Adequate    SPECIAL CARE FACTORS FREQUENCY  PT (By licensed PT), OT (By licensed OT), Speech therapy     PT Frequency: 5 OT Frequency: 5     Speech Therapy Frequency: 5      Contractures      Additional Factors Info  Code Status, Allergies, Psychotropic, Insulin Sliding Scale Code Status Info: Full Code Allergies Info: Actos Pioglitazone, Byetta 10 Mcg Pen Exenatide, Victoza Liraglutide Psychotropic Info: Medication:  Celexa Insulin  Sliding Scale Info: 4x/day       Current Medications (02/18/2017):  This is the current hospital active medication list Current Facility-Administered Medications  Medication Dose Route Frequency Provider Last Rate Last Dose  . acetaminophen (TYLENOL) tablet 650 mg  650 mg Oral Q4H PRN Gouru, Aruna, MD       Or  . acetaminophen (TYLENOL) solution 650 mg  650 mg Per Tube Q4H PRN Gouru, Aruna, MD       Or  . acetaminophen (TYLENOL) suppository 650 mg  650 mg Rectal Q4H PRN Gouru, Aruna, MD      . aspirin chewable tablet 81 mg  81 mg Oral Daily Gouru, Aruna, MD   81 mg at 02/18/17 0945  . atorvastatin (LIPITOR) tablet 40 mg  40 mg Oral q1800 Gouru, Aruna, MD   40 mg at 02/17/17 1624  . citalopram (CELEXA) tablet 20 mg  20 mg Oral Daily Gouru, Aruna, MD      . clopidogrel (PLAVIX) tablet 75 mg  75 mg Oral Daily Gouru, Aruna, MD   75 mg at 02/18/17 0944  . enoxaparin (LOVENOX) injection 40 mg  40 mg Subcutaneous Q24H Gouru, Aruna, MD   40 mg at 02/17/17 2036  . glipiZIDE (GLUCOTROL) tablet 10 mg  10 mg Oral BID AC Gouru, Aruna, MD   10 mg at 02/18/17 0944  . hydrochlorothiazide (MICROZIDE) capsule 12.5 mg  12.5 mg Oral Daily Gouru, Aruna, MD   12.5 mg at 02/18/17 0944  . insulin aspart (  novoLOG) injection 0-5 Units  0-5 Units Subcutaneous QHS Gouru, Aruna, MD      . insulin aspart (novoLOG) injection 0-9 Units  0-9 Units Subcutaneous TID WC Gouru, Aruna, MD   1 Units at 02/18/17 1216  . insulin aspart protamine- aspart (NOVOLOG MIX 70/30) injection 30 Units  30 Units Subcutaneous BID AC & HS Gouru, Aruna, MD   30 Units at 02/18/17 0841  . lisinopril (PRINIVIL,ZESTRIL) tablet 20 mg  20 mg Oral Daily Gouru, Aruna, MD   20 mg at 02/18/17 0944  . metFORMIN (GLUCOPHAGE) tablet 1,000 mg  1,000 mg Oral BID WC Gouru, Aruna, MD   1,000 mg at 02/18/17 0944  . pantoprazole (PROTONIX) EC tablet 40 mg  40 mg Oral Daily Gouru, Aruna, MD   40 mg at 02/17/17 1624  . pregabalin (LYRICA) capsule 225 mg  225 mg Oral  BID Gouru, Aruna, MD   225 mg at 02/18/17 0945  . rOPINIRole (REQUIP) tablet 0.5 mg  0.5 mg Oral QPM Gouru, Aruna, MD   0.5 mg at 02/17/17 1624  . Vitamin D (Ergocalciferol) (DRISDOL) capsule 50,000 Units  50,000 Units Oral Q7 days Nicholes Mango, MD   50,000 Units at 02/18/17 8616     Discharge Medications: Please see discharge summary for a list of discharge medications.  Relevant Imaging Results:  Relevant Lab Results:   Additional Information SSN:  [id number]  Darden Dates, LCSW

## 2017-02-18 NOTE — Discharge Summary (Signed)
[facility] NAME: Kimberly Duarte    MR#:  [id number]  DATE OF BIRTH:  Oct 12, 1940  DATE OF ADMISSION:  02/17/2017   ADMITTING PHYSICIAN: Nicholes Mango, MD  DATE OF DISCHARGE: 02/18/2017  PRIMARY CARE PHYSICIAN: Tracie Harrier, MD   ADMISSION DIAGNOSIS:  Cerebrovascular accident (CVA), unspecified mechanism ([place]) [I63.9] DISCHARGE DIAGNOSIS:  Active Problems:   CVA (cerebral vascular accident) ([place])  SECONDARY DIAGNOSIS:   Past Medical History:  Diagnosis Date  . Depression   . Diabetes mellitus without complication ([place])   . Diabetic peripheral neuropathy ([place])   . GERD (gastroesophageal reflux disease)   . Hyperlipidemia   . Hypertension   . Obesity   . Stroke ([place])   . Uterine cancer [facility])    HOSPITAL COURSE:   Kimberly Duarte  is a 76 y.o. female with a known history of Diabetes mellitus, GERD, hyperlipidemia, hypertension was just admitted to Hospital on May 19 due to a left thalamic infarct and right sided weakness and numbness and was discharged on May 20 with baby aspirin and Plavix. Today while she was on her way to see her cardiologist she has developed right upper extremity heaviness and numbness which is new.  #Recent left thalamic infarct with worsening of right lower extremity weakness and new onset right upper extremity heaviness with dysarthria  There is no change in size of the left IC infarct.  No additional acute infarcts noted either per Dr. Marcha Solders.  PT, outpatient OT, speech therapy consult CT angiogram of the head and neck shows mild to moderate posterior circulation atherosclerosis.  resumed aspirin and Plavix.  #Diabetes mellitus, on sliding scale insulin  #Hyperlipidemia resumed statin #Hypertension will allow permissive hypertension   #GERD-PPI  Diabetes.I discussed with Dr. Doy Mince. DISCHARGE CONDITIONS:  Stable, discharge to home today. CONSULTS OBTAINED:  Treatment Team:    Catarina Hartshorn, MD DRUG ALLERGIES:   Allergies  Allergen Reactions  . Actos [Pioglitazone] Other (See Comments)    Reaction:  Stomach cramps   . Byetta 10 Mcg Pen [Exenatide] Nausea And Vomiting  . Victoza [Liraglutide] Other (See Comments)    Reaction:  Burning and stinging of feet     DISCHARGE MEDICATIONS:   Allergies as of 02/18/2017      Reactions   Actos [pioglitazone] Other (See Comments)   Reaction:  Stomach cramps    Byetta 10 Mcg Pen [exenatide] Nausea And Vomiting   Victoza [liraglutide] Other (See Comments)   Reaction:  Burning and stinging of feet       Medication List    TAKE these medications   aspirin 81 MG chewable tablet Chew 1 tablet (81 mg total) by mouth daily.   atorvastatin 40 MG tablet Commonly known as:  LIPITOR Take 1 tablet (40 mg total) by mouth daily at 6 PM.   citalopram 20 MG tablet Commonly known as:  CELEXA Take 20 mg by mouth daily.   clopidogrel 75 MG tablet Commonly known as:  PLAVIX Take 1 tablet (75 mg total) by mouth daily.   Exenatide ER 2 MG/0.85ML Auij Inject 2 mg into the skin once a week.   glipiZIDE 10 MG tablet Commonly known as:  GLUCOTROL Take 10 mg by mouth 2 (two) times daily.   hydrochlorothiazide 12.5 MG capsule Commonly known as:  MICROZIDE Take 12.5 mg by mouth daily.   insulin NPH Human 100 UNIT/ML injection Commonly known as:  HUMULIN N,NOVOLIN N Inject 15-30 Units into the skin 3 (  three) times daily. tk 30 units qam and 30 units qhs, and if sugar is high during the afternoon, tk an additional 15 units   lisinopril 20 MG tablet Commonly known as:  PRINIVIL,ZESTRIL Take 20 mg by mouth daily.   meloxicam 15 MG tablet Commonly known as:  MOBIC Take 15 mg by mouth daily.   metFORMIN 1000 MG tablet Commonly known as:  GLUCOPHAGE Take 1,000 mg by mouth 2 (two) times daily with a meal.   omeprazole 20 MG capsule Commonly known as:  PRILOSEC Take 20 mg by mouth daily as needed.   pregabalin 225  MG capsule Commonly known as:  LYRICA Take 225 mg by mouth 2 (two) times daily.   rOPINIRole 0.5 MG tablet Commonly known as:  REQUIP Take 0.5 mg by mouth every evening.   Vitamin D (Ergocalciferol) 50000 units Caps capsule Commonly known as:  DRISDOL Take 50,000 Units by mouth every 7 (seven) days.        DISCHARGE INSTRUCTIONS:  See AVS.  If you experience worsening of your admission symptoms, develop shortness of breath, life threatening emergency, suicidal or homicidal thoughts you must seek medical attention immediately by calling 911 or calling your MD immediately  if symptoms less severe.  You Must read complete instructions/literature along with all the possible adverse reactions/side effects for all the Medicines you take and that have been prescribed to you. Take any new Medicines after you have completely understood and accpet all the possible adverse reactions/side effects.   Please note  You were cared for by a hospitalist during your hospital stay. If you have any questions about your discharge medications or the care you received while you were in the hospital after you are discharged, you can call the unit and asked to speak with the hospitalist on call if the hospitalist that took care of you is not available. Once you are discharged, your primary care physician will handle any further medical issues. Please note that NO REFILLS for any discharge medications will be authorized once you are discharged, as it is imperative that you return to your primary care physician (or establish a relationship with a primary care physician if you do not have one) for your aftercare needs so that they can reassess your need for medications and monitor your lab values.    On the day of Discharge:  VITAL SIGNS:  Blood pressure (!) 143/65, pulse 77, temperature 97.9 F (36.6 C), temperature source Oral, resp. rate 20, height 5\' 3"  (1.6 m), weight 195 lb (88.5 kg), SpO2 100 %. PHYSICAL  EXAMINATION:  GENERAL:  76 y.o.-year-old patient lying in the bed with no acute distress.  EYES: Pupils equal, round, reactive to light and accommodation. No scleral icterus. Extraocular muscles intact.  HEENT: Head atraumatic, normocephalic. Oropharynx and nasopharynx clear.  NECK:  Supple, no jugular venous distention. No thyroid enlargement, no tenderness.  LUNGS: Normal breath sounds bilaterally, no wheezing, rales,rhonchi or crepitation. No use of accessory muscles of respiration.  CARDIOVASCULAR: S1, S2 normal. No murmurs, rubs, or gallops.  ABDOMEN: Soft, non-tender, non-distended. Bowel sounds present. No organomegaly or mass.  EXTREMITIES: No pedal edema, cyanosis, or clubbing.  NEUROLOGIC: Cranial nerves II through XII are intact. Muscle strength 5/5 in all extremities except 4/5 on right leg. Sensation intact. Gait not checked.  PSYCHIATRIC: The patient is alert and oriented x 3.  SKIN: No obvious rash, lesion, or ulcer.  DATA REVIEW:   CBC  Recent Labs Lab 02/17/17 1037  WBC 8.7  HGB 14.6  HCT 43.0  PLT 207    Chemistries   Recent Labs Lab 02/17/17 1037  NA 140  K 3.8  CL 105  CO2 24  GLUCOSE 172*  BUN 12  CREATININE 0.86  CALCIUM 9.5  AST 26  ALT 18  ALKPHOS 63  BILITOT 0.7     Microbiology Results  No results found for this or any previous visit.  RADIOLOGY:  Ct Angio Head W/cm &/or Wo Cm  Result Date: 02/17/2017 CLINICAL DATA:  76 y/o F; recent left thalamus/posterior limb of internal capsule infarction. Interval development of right upper extremity heaviness and numbness which is new as well as right lower extremity weakness and slurred speech. EXAM: CT ANGIOGRAPHY HEAD AND NECK TECHNIQUE: Multidetector CT imaging of the head and neck was performed using the standard protocol during bolus administration of intravenous contrast. Multiplanar CT image reconstructions and MIPs were obtained to evaluate the vascular anatomy. Carotid stenosis measurements  (when applicable) are obtained utilizing NASCET criteria, using the distal internal carotid diameter as the denominator. CONTRAST:  75 cc Isovue 370 COMPARISON:  02/15/2017 MRI of the head.  02/17/2017 CT of the head. FINDINGS: CTA NECK FINDINGS Aortic arch: Bovine arch, normal variant. Mild calcific atherosclerosis. No aneurysm. Right carotid system: No evidence of dissection, stenosis (50% or greater) or occlusion.Mild predominantly fibrofatty plaque of the bifurcation without significant stenosis. Left carotid system: No evidence of dissection, stenosis (50% or greater) or occlusion. Left carotid bifurcation moderate predominantly calcified plaque with mild 30-40% proximal ICA stenosis. Vertebral arteries: Right dominant. No evidence of dissection, stenosis (50% or greater) or occlusion. Skeleton: Moderate cervical spondylosis with discogenic degenerative changes greatest at the C5-6 level, left-sided upper cervical and right-sided mid cervical facet arthrosis, and slight reversal of curvature at the C5-6 level. Other neck: Fatty replacement of the parotid glands bilaterally compatible with sequelae of chronic parotiditis. Thyroid nodules measuring up to 6 mm in right lobe of thyroid. Upper chest: Negative. Review of the MIP images confirms the above findings CTA HEAD FINDINGS Anterior circulation: No significant stenosis, proximal occlusion, aneurysm, or vascular malformation. Mild non stenotic calcific atherosclerosis of cavernous and paraclinoid internal carotid arteries. Posterior circulation: Left V4 short segment of mild-to-moderate stenosis and right V4 small calcified plaque with mild stenosis. The basilar short segment of mild stenosis. Mild irregularity of bilateral P2 with segments of mild right greater than left stenosis. No large vessel occlusion, aneurysm, or high-grade stenosis is identified. Venous sinuses: As permitted by contrast timing, patent. Anatomic variants: Patent anterior and posterior  communicating arteries. Large left posterior communicating artery with small P1 segment compatible with persistent fetal circulation. Delayed phase: No abnormal intracranial enhancement. Review of the MIP images confirms the above findings IMPRESSION: 1. Patent carotid and vertebral arteries. No large vessel occlusion, aneurysm, or high-grade stenosis is identified. 2. Left carotid bifurcation calcified plaque with mild 30-40% proximal left ICA stenosis. 3. Patent circle of Willis. No large vessel occlusion, aneurysm, or high-grade stenosis is identified. 4. Intracranial atherosclerosis greatest in the posterior circulation with there are multiple areas of mild-to-moderate stenosis. Electronically Signed   By: Kristine Garbe M.D.   On: 02/17/2017 15:26   Dg Chest 2 View  Result Date: 02/17/2017 CLINICAL DATA:  Stroke.  CED EXAM: CHEST  2 VIEW COMPARISON:  11/27/2015 FINDINGS: There is increased rightward patient rotation compared to the prior study which limits assessment of the mediastinal contours. The cardiac silhouette appears mildly enlarged, accentuated by patient rotation and AP technique. No  airspace consolidation, edema, pleural effusion, or pneumothorax is identified. No acute osseous abnormality is seen. IMPRESSION: No active cardiopulmonary disease. Electronically Signed   By: Logan Bores M.D.   On: 02/17/2017 15:35   Ct Angio Neck W/cm &/or Wo/cm  Result Date: 02/17/2017 CLINICAL DATA:  76 y/o F; recent left thalamus/posterior limb of internal capsule infarction. Interval development of right upper extremity heaviness and numbness which is new as well as right lower extremity weakness and slurred speech. EXAM: CT ANGIOGRAPHY HEAD AND NECK TECHNIQUE: Multidetector CT imaging of the head and neck was performed using the standard protocol during bolus administration of intravenous contrast. Multiplanar CT image reconstructions and MIPs were obtained to evaluate the vascular anatomy.  Carotid stenosis measurements (when applicable) are obtained utilizing NASCET criteria, using the distal internal carotid diameter as the denominator. CONTRAST:  75 cc Isovue 370 COMPARISON:  02/15/2017 MRI of the head.  02/17/2017 CT of the head. FINDINGS: CTA NECK FINDINGS Aortic arch: Bovine arch, normal variant. Mild calcific atherosclerosis. No aneurysm. Right carotid system: No evidence of dissection, stenosis (50% or greater) or occlusion.Mild predominantly fibrofatty plaque of the bifurcation without significant stenosis. Left carotid system: No evidence of dissection, stenosis (50% or greater) or occlusion. Left carotid bifurcation moderate predominantly calcified plaque with mild 30-40% proximal ICA stenosis. Vertebral arteries: Right dominant. No evidence of dissection, stenosis (50% or greater) or occlusion. Skeleton: Moderate cervical spondylosis with discogenic degenerative changes greatest at the C5-6 level, left-sided upper cervical and right-sided mid cervical facet arthrosis, and slight reversal of curvature at the C5-6 level. Other neck: Fatty replacement of the parotid glands bilaterally compatible with sequelae of chronic parotiditis. Thyroid nodules measuring up to 6 mm in right lobe of thyroid. Upper chest: Negative. Review of the MIP images confirms the above findings CTA HEAD FINDINGS Anterior circulation: No significant stenosis, proximal occlusion, aneurysm, or vascular malformation. Mild non stenotic calcific atherosclerosis of cavernous and paraclinoid internal carotid arteries. Posterior circulation: Left V4 short segment of mild-to-moderate stenosis and right V4 small calcified plaque with mild stenosis. The basilar short segment of mild stenosis. Mild irregularity of bilateral P2 with segments of mild right greater than left stenosis. No large vessel occlusion, aneurysm, or high-grade stenosis is identified. Venous sinuses: As permitted by contrast timing, patent. Anatomic variants:  Patent anterior and posterior communicating arteries. Large left posterior communicating artery with small P1 segment compatible with persistent fetal circulation. Delayed phase: No abnormal intracranial enhancement. Review of the MIP images confirms the above findings IMPRESSION: 1. Patent carotid and vertebral arteries. No large vessel occlusion, aneurysm, or high-grade stenosis is identified. 2. Left carotid bifurcation calcified plaque with mild 30-40% proximal left ICA stenosis. 3. Patent circle of Willis. No large vessel occlusion, aneurysm, or high-grade stenosis is identified. 4. Intracranial atherosclerosis greatest in the posterior circulation with there are multiple areas of mild-to-moderate stenosis. Electronically Signed   By: Kristine Garbe M.D.   On: 02/17/2017 15:26   Mr Brain Wo Contrast  Result Date: 02/17/2017 CLINICAL DATA:  76 y/o F; left thalamic infarct with worsening right-sided weakness. EXAM: MRI HEAD WITHOUT CONTRAST TECHNIQUE: Multiplanar, multiecho pulse sequences of the brain and surrounding structures were obtained without intravenous contrast. COMPARISON:  02/15/2017 MRI of the head. FINDINGS: Brain: Focus of reduced diffusion within the left posterior limb of internal capsule demonstrates a more pronounced signal abnormality but not measurably different size in comparison with the prior MRI of the head (series 5, image 24). Additionally, T2 FLAIR hyperintense signal abnormality associated  with the infarct is mildly increased. Several stable nonspecific foci of T2 FLAIR hyperintense signal abnormality are present in subcortical and periventricular white matter compatible with mild chronic microvascular ischemic changes. There is mild diffuse brain parenchymal volume loss. No abnormal susceptibility hypointensity to indicate intracranial hemorrhage. No focal mass effect. Cavum velum interpositum. Vascular: Normal flow voids. Skull and upper cervical spine: Normal marrow  signal. Sinuses/Orbits: Negative. Other: None. IMPRESSION: 1. Acute infarction within left posterior limb of internal capsule demonstrates more pronounced signal abnormality without measurably different size compared with prior MRI of the brain. Findings likely represent slight progression of the prior infarction. No new acute infarction or hemorrhage identified. 2. Stable background of mild chronic microvascular ischemic changes and mild parenchymal volume loss of the brain. Electronically Signed   By: Kristine Garbe M.D.   On: 02/17/2017 16:07   US Carotid Bilateral (at [facility] Only)  Result Date: 02/17/2017 CLINICAL DATA:  CVA.  Bilateral leg weakness. EXAM: BILATERAL CAROTID DUPLEX ULTRASOUND TECHNIQUE: Pearline Cables scale imaging, color Doppler and duplex ultrasound were performed of bilateral carotid and vertebral arteries in the neck. COMPARISON:  None. FINDINGS: Criteria: Quantification of carotid stenosis is based on velocity parameters that correlate the residual internal carotid diameter with NASCET-based stenosis levels, using the diameter of the distal internal carotid lumen as the denominator for stenosis measurement. The following velocity measurements were obtained: RIGHT ICA:  116 cm/sec CCA:  69 cm/sec SYSTOLIC ICA/CCA RATIO:  1.6 DIASTOLIC ICA/CCA RATIO:  2.1 ECA:  107 cm/sec LEFT ICA:  92 cm/sec CCA:  83 cm/sec SYSTOLIC ICA/CCA RATIO:  1.1 DIASTOLIC ICA/CCA RATIO:  1.9 ECA:  99 cm/sec RIGHT CAROTID ARTERY: Mild calcified plaque in the bulb. Low resistance internal carotid Doppler pattern. RIGHT VERTEBRAL ARTERY:  Antegrade. LEFT CAROTID ARTERY: Mild calcified plaque in the bulb. Low resistance internal carotid Doppler pattern. LEFT VERTEBRAL ARTERY:  Antegrade. IMPRESSION: Less than 50% stenosis in the right and left internal carotid arteries. Electronically Signed   By: Marybelle Killings M.D.   On: 02/17/2017 15:29     Management plans discussed with the patient, her daughter and they  are in agreement.  CODE STATUS: Full Code   TOTAL TIME TAKING CARE OF THIS PATIENT: 33 minutes.    Demetrios Loll M.D on 02/18/2017 at 1:50 PM  Between 7am to 6pm - Pager - [phone]  After 6pm go to [email or web address] - Proofreader  [facility]  Office  [phone]  CC: Primary care physician; Tracie Harrier, MD   Note: This dictation was prepared with Dragon dictation along with smaller phrase technology. Any transcriptional errors that result from this process are unintentional.

## 2017-02-18 NOTE — Care Management Obs Status (Signed)
[facility] NOTIFICATION   Patient Details  Name: Kimberly Duarte MRN: [id number] Date of Birth: [birth year]   Medicare Observation Status Notification Given:  Yes    Shelbie Ammons, RN 02/18/2017, 8:00 AM

## 2017-02-18 NOTE — Clinical Social Work Note (Addendum)
Clinical Social Work Assessment  Patient Details  Name: Kimberly Duarte MRN: [id number] Date of Birth: 06-May-1941  Date of referral:  02/18/17               Reason for consult:  Facility Placement                Permission sought to share information with:  Chartered certified accountant granted to share information::  Yes, Verbal Permission Granted  Name::      [place]::   [place]   Relationship::     Contact Information:     Housing/Transportation Living arrangements for the past 2 months:  [place] of Information:  Patient, Adult Children Patient Interpreter Needed:  None Criminal Activity/Legal Involvement Pertinent to Current Situation/Hospitalization:  No - Comment as needed Significant Relationships:  Adult Children, Siblings Lives with:  Self Do you feel safe going back to the place where you live?  Yes Need for family participation in patient care:  Yes (Comment)  Care giving concerns:  Patient lives alone in [place].    Social Worker assessment / plan:  Holiday representative (CSW) received SNF consult. PT is recommending SNF. CSW met with patient and her daughter [name] [phone]) [phone] was at bedside. CSW introduced self and explained role of CSW department. Patient reported that she lives alone in [place] and has 4 adult children, 3 girls and 1 boy. Per patient she does not have a HPOA. CSW explained that PT is recommending SNF and explained process. Patient and daughter are agreeable to SNF search in [place]. FL2 complete and faxed out. CSW presented bed offers and patient and daughter chose [facility]. PASARR is pending. CSW faxed H&P, FL2 and 30 day note to [facility] today. [facility] liaison is aware of accepted bed offer. CSW will continue to follow and assist as needed.   CSW gave daughter [name] a copy of a blank Advanced Directive and Living Will at her request.   Employment status:  Retired Designer, industrial/product PT Recommendations:  [place] / Referral to community resources:  [place]  Patient/Family's Response to care:  Patient and daughter [name] are agreeable for patient to D/C to [facility].   Patient/Family's Understanding of and Emotional Response to Diagnosis, Current Treatment, and Prognosis:  Patient and her family were very pleasant and thanked CSW for visit.   Emotional Assessment Appearance:  Appears stated age Attitude/Demeanor/Rapport:    Affect (typically observed):  Accepting, Adaptable, Pleasant Orientation:  Oriented to Self, Oriented to Place, Oriented to  Time, Oriented to Situation Alcohol / Substance use:  Not Applicable Psych involvement (Current and /or in the community):  No (Comment)  Discharge Needs  Concerns to be addressed:  Discharge Planning Concerns Readmission within the last 30 days:  No Current discharge risk:  Dependent with Mobility Barriers to Discharge:  Continued Medical Work up   [employer], Veronia Beets, LCSW 02/18/2017, 3:42 PM

## 2017-02-18 NOTE — Clinical Social Work Placement (Signed)
   CLINICAL SOCIAL WORK PLACEMENT  NOTE  Date:  02/18/2017  Patient Details  Name: Kimberly Duarte MRN: [id number] Date of Birth: Feb 02, 1941  Clinical Social Work is seeking post-discharge placement for this patient at the [place] level of care (*CSW will initial, date and re-position this form in  chart as items are completed):  Yes   Patient/family provided with [facility] Work Department's list of facilities offering this level of care within the geographic area requested by the patient (or if unable, by the patient's family).  Yes   Patient/family informed of their freedom to choose among providers that offer the needed level of care, that participate in Medicare, Medicaid or managed care program needed by the patient, have an available bed and are willing to accept the patient.  Yes   Patient/family informed of [facility]'s ownership interest in [facility] and [facility], as well as of the fact that they are under no obligation to receive care at these facilities.  PASRR submitted to EDS on 02/18/17     PASRR number received on       Existing PASRR number confirmed on       FL2 transmitted to all facilities in geographic area requested by pt/family on 02/18/17     FL2 transmitted to all facilities within larger geographic area on       Patient informed that his/her managed care company has contracts with or will negotiate with certain facilities, including the following:        Yes   Patient/family informed of bed offers received.  Patient chooses bed at  ([facility] )     Physician recommends and patient chooses bed at      Patient to be transferred to   on  .  Patient to be transferred to facility by       Patient family notified on   of transfer.  Name of family member notified:        PHYSICIAN       Additional Comment:    _______________________________________________ Shianna Bally, Veronia Beets, LCSW 02/18/2017, 3:41 PM

## 2017-02-19 DIAGNOSIS — I639 Cerebral infarction, unspecified: Secondary | ICD-10-CM | POA: Diagnosis not present

## 2017-02-19 LAB — GLUCOSE, CAPILLARY
Glucose-Capillary: 145 mg/dL — ABNORMAL HIGH (ref 65–99)
Glucose-Capillary: 230 mg/dL — ABNORMAL HIGH (ref 65–99)

## 2017-02-19 NOTE — Progress Notes (Signed)
Physical Therapy Treatment Patient Details Name: Kimberly Duarte MRN: [id number] DOB: [birth year] Today's Date: 02/19/2017    History of Present Illness Pt is a 76 y.o. female pt with recent admission to ED for L thalamic infarct on 5/19 and discharged 5/20; back to ED on 5/21 with R side weakness/numbness. PMHx includes HTN, DM, GERD, peripheral neuropathy, prior CVA, uterine cancer, and depression.  MRI indicates no change in size of the left IC infarct and no additional acute infarcts.    PT Comments    Pt agreeable to PT; reports continued right sided weakness in lower extremity. Pt Mod I for bed mobility and stand transfers; requires cues for seated transfer for safe hand placement. Pt improving ambulation with notable Right lower extremity weakness; encouraged quad set with right stance phase, which improved stability. Ambulation slow, but required less assist (min guard) than last session. No overt loss of balance. Pt participates in seated and long sit exercises for strengthening. Pt currently with discharge to skilled nursing facility to continue rehab efforts.    Follow Up Recommendations  SNF     Equipment Recommendations  Rolling walker with 5" wheels    Recommendations for Other Services       Precautions / Restrictions Precautions Precautions: Fall Restrictions Weight Bearing Restrictions: No    Mobility  Bed Mobility Overal bed mobility: Modified Independent             General bed mobility comments: use of rails and mild increaed time  Transfers Overall transfer level: Needs assistance Equipment used: Rolling walker (2 wheeled) Transfers: Sit to/from Stand Sit to Stand: Min guard;Supervision         General transfer comment: cues to use rail with sit at commode and recliner versus rw. Good use of hands with stand  Ambulation/Gait Ambulation/Gait assistance: Min guard Ambulation Distance (Feet): 60 Feet Assistive device: Rolling walker (2  wheeled) Gait Pattern/deviations: Step-through pattern Gait velocity: slow    General Gait Details: Improved BOS; when questioned, pt does note unsteady feeling in RLE. Advised to QS when stand on RLE and stepping L. Pt notes improvement with technique.    Stairs            Wheelchair Mobility    Modified Rankin (Stroke Patients Only)       Balance                                            Cognition Arousal/Alertness: Awake/alert Behavior During Therapy: WFL for tasks assessed/performed Overall Cognitive Status: Within Functional Limits for tasks assessed                                        Exercises General Exercises - Lower Extremity Ankle Circles/Pumps: AROM;Both;20 reps Quad Sets: Strengthening;Both;20 reps Gluteal Sets: Strengthening;Both;20 reps Long Arc Quad: AROM;Both;20 reps;Seated Hip ABduction/ADduction: AROM;Both;20 reps;Seated Mini-Sqauts: Strengthening;Both;5 reps    General Comments        Pertinent Vitals/Pain Pain Assessment: No/denies pain    Home Living                      Prior Function            PT Goals (current goals can now be found in the care plan section)  Frequency    7X/week      PT Plan Current plan remains appropriate    Co-evaluation              AM-PAC PT "6 Clicks" Daily Activity  Outcome Measure  Difficulty turning over in bed (including adjusting bedclothes, sheets and blankets)?: None Difficulty moving from lying on back to sitting on the side of the bed? : None Difficulty sitting down on and standing up from a chair with arms (e.g., wheelchair, bedside commode, etc,.)?: A Little Help needed moving to and from a bed to chair (including a wheelchair)?: A Little Help needed walking in hospital room?: A Little Help needed climbing 3-5 steps with a railing? : A Little 6 Click Score: 20    End of Session Equipment Utilized During Treatment: Gait  belt Activity Tolerance: Patient tolerated treatment well Patient left: in chair;with call bell/phone within reach;with chair alarm set   PT Visit Diagnosis: Unsteadiness on feet (R26.81);Other abnormalities of gait and mobility (R26.89)     Time: 1001-1026 PT Time Calculation (min) (ACUTE ONLY): 25 min  Charges:  $Gait Training: 8-22 mins $Therapeutic Exercise: 8-22 mins                    G Codes:        Larae Grooms, PTA 02/19/2017, 11:32 AM

## 2017-02-19 NOTE — Discharge Instructions (Signed)
Heart healthy and ADA diet. °

## 2017-02-19 NOTE — Clinical Social Work Note (Signed)
Pt is ready for discharge today and will go to [facility]. Pt is aware and agreeable to discharge plan. Facility is ready to admit pt as they have received discharge information. RN will call report. [facility] will provide transportation. CSW is signing off as no further needs identified.   Darden Dates, MSW, LCSW Clinical Social Worker  [phone]

## 2017-02-19 NOTE — Clinical Social Work Placement (Signed)
   CLINICAL SOCIAL WORK PLACEMENT  NOTE  Date:  02/19/2017  Patient Details  Name: Kimberly Duarte MRN: [id number] Date of Birth: 02/23/1941  Clinical Social Work is seeking post-discharge placement for this patient at the [place] level of care (*CSW will initial, date and re-position this form in  chart as items are completed):  Yes   Patient/family provided with [place] Work Department's list of facilities offering this level of care within the geographic area requested by the patient (or if unable, by the patient's family).  Yes   Patient/family informed of their freedom to choose among providers that offer the needed level of care, that participate in Medicare, Medicaid or managed care program needed by the patient, have an available bed and are willing to accept the patient.  Yes   Patient/family informed of [facility]'s ownership interest in [facility] and [facility], as well as of the fact that they are under no obligation to receive care at these facilities.  PASRR submitted to EDS on 02/18/17     PASRR number received on 02/19/17     Existing PASRR number confirmed on       FL2 transmitted to all facilities in geographic area requested by pt/family on 02/18/17     FL2 transmitted to all facilities within larger geographic area on       Patient informed that his/her managed care company has contracts with or will negotiate with certain facilities, including the following:        Yes   Patient/family informed of bed offers received.  [facility]     Physician recommends and patient chooses bed at      Patient to be transferred to [facility] [place] on 02/19/17.  Patient to be transferred to facility by [facility]     Patient family notified on 02/19/17 of transfer.  Name of family member notified:  pt will update family.      PHYSICIAN       Additional  Comment:    _______________________________________________ Darden Dates, LCSW 02/19/2017, 11:08 AM

## 2017-02-19 NOTE — Progress Notes (Signed)
EMS called for transport. Khadir Roam S, RN  

## 2017-02-19 NOTE — Progress Notes (Signed)
Patient discharged to [facility] via EMS. Madlyn Frankel, RN

## 2017-02-19 NOTE — Discharge Summary (Signed)
[facility] [place] NAME: Kimberly Duarte    MR#:  [id number]  DATE OF BIRTH:  December 12, 1940  DATE OF ADMISSION:  02/17/2017   ADMITTING PHYSICIAN: Nicholes Mango, MD  DATE OF DISCHARGE: 02/19/2017  PRIMARY CARE PHYSICIAN: Tracie Harrier, MD   ADMISSION DIAGNOSIS:  Cerebrovascular accident (CVA), unspecified mechanism ([place]) [I63.9] DISCHARGE DIAGNOSIS:  Active Problems:   CVA (cerebral vascular accident) ([place])  SECONDARY DIAGNOSIS:   Past Medical History:  Diagnosis Date  . Depression   . Diabetes mellitus without complication ([place])   . Diabetic peripheral neuropathy ([place])   . GERD (gastroesophageal reflux disease)   . Hyperlipidemia   . Hypertension   . Obesity   . Stroke ([place])   . Uterine cancer [facility])    HOSPITAL COURSE:   Kimberly Duarte  is a 76 y.o. female with a known history of Diabetes mellitus, GERD, hyperlipidemia, hypertension was just admitted to Hospital on May 19 due to a left thalamic infarct and right sided weakness and numbness and was discharged on May 20 with baby aspirin and Plavix. Today while she was on her way to see her cardiologist she has developed right upper extremity heaviness and numbness which is new.  #Recent left thalamic infarct with worsening of right lower extremity weakness and new onset right upper extremity heaviness with dysarthria  There is no change in size of the left IC infarct.  No additional acute infarcts noted either per Dr. Marcha Solders.  PT, outpatient OT, speech therapy consult CT angiogram of the head and neck shows mild to moderate posterior circulation atherosclerosis.  resumed aspirin and Plavix.  #Diabetes mellitus, on sliding scale insulin  #Hyperlipidemia resumed statin #Hypertension will allow permissive hypertension   #GERD-PPI  Diabetes.  Per PT evaluation, patient needs SNF. DISCHARGE CONDITIONS:  Stable, discharge to SNF today. CONSULTS OBTAINED:  Treatment  Team:  Catarina Hartshorn, MD DRUG ALLERGIES:   Allergies  Allergen Reactions  . Actos [Pioglitazone] Other (See Comments)    Reaction:  Stomach cramps   . Byetta 10 Mcg Pen [Exenatide] Nausea And Vomiting  . Victoza [Liraglutide] Other (See Comments)    Reaction:  Burning and stinging of feet     DISCHARGE MEDICATIONS:   Allergies as of 02/19/2017      Reactions   Actos [pioglitazone] Other (See Comments)   Reaction:  Stomach cramps    Byetta 10 Mcg Pen [exenatide] Nausea And Vomiting   Victoza [liraglutide] Other (See Comments)   Reaction:  Burning and stinging of feet       Medication List    TAKE these medications   aspirin 81 MG chewable tablet Chew 1 tablet (81 mg total) by mouth daily.   atorvastatin 40 MG tablet Commonly known as:  LIPITOR Take 1 tablet (40 mg total) by mouth daily at 6 PM.   citalopram 20 MG tablet Commonly known as:  CELEXA Take 20 mg by mouth daily.   clopidogrel 75 MG tablet Commonly known as:  PLAVIX Take 1 tablet (75 mg total) by mouth daily.   Exenatide ER 2 MG/0.85ML Auij Inject 2 mg into the skin once a week.   glipiZIDE 10 MG tablet Commonly known as:  GLUCOTROL Take 10 mg by mouth 2 (two) times daily.   hydrochlorothiazide 12.5 MG capsule Commonly known as:  MICROZIDE Take 12.5 mg by mouth daily.   insulin NPH Human 100 UNIT/ML injection Commonly known as:  HUMULIN N,NOVOLIN N Inject 15-30 Units into the  skin 3 (three) times daily. tk 30 units qam and 30 units qhs, and if sugar is high during the afternoon, tk an additional 15 units   lisinopril 20 MG tablet Commonly known as:  PRINIVIL,ZESTRIL Take 20 mg by mouth daily.   meloxicam 15 MG tablet Commonly known as:  MOBIC Take 15 mg by mouth daily.   metFORMIN 1000 MG tablet Commonly known as:  GLUCOPHAGE Take 1,000 mg by mouth 2 (two) times daily with a meal.   omeprazole 20 MG capsule Commonly known as:  PRILOSEC Take 20 mg by mouth daily as needed.     pregabalin 225 MG capsule Commonly known as:  LYRICA Take 225 mg by mouth 2 (two) times daily.   rOPINIRole 0.5 MG tablet Commonly known as:  REQUIP Take 0.5 mg by mouth every evening.   Vitamin D (Ergocalciferol) 50000 units Caps capsule Commonly known as:  DRISDOL Take 50,000 Units by mouth every 7 (seven) days.        DISCHARGE INSTRUCTIONS:  See AVS.  If you experience worsening of your admission symptoms, develop shortness of breath, life threatening emergency, suicidal or homicidal thoughts you must seek medical attention immediately by calling 911 or calling your MD immediately  if symptoms less severe.  You Must read complete instructions/literature along with all the possible adverse reactions/side effects for all the Medicines you take and that have been prescribed to you. Take any new Medicines after you have completely understood and accpet all the possible adverse reactions/side effects.   Please note  You were cared for by a hospitalist during your hospital stay. If you have any questions about your discharge medications or the care you received while you were in the hospital after you are discharged, you can call the unit and asked to speak with the hospitalist on call if the hospitalist that took care of you is not available. Once you are discharged, your primary care physician will handle any further medical issues. Please note that NO REFILLS for any discharge medications will be authorized once you are discharged, as it is imperative that you return to your primary care physician (or establish a relationship with a primary care physician if you do not have one) for your aftercare needs so that they can reassess your need for medications and monitor your lab values.    On the day of Discharge:  VITAL SIGNS:  Blood pressure 133/73, pulse 77, temperature 98.9 F (37.2 C), temperature source Oral, resp. rate 18, height 5\' 3"  (1.6 m), weight 195 lb (88.5 kg), SpO2 95  %. PHYSICAL EXAMINATION:  GENERAL:  76 y.o.-year-old patient lying in the bed with no acute distress.  EYES: Pupils equal, round, reactive to light and accommodation. No scleral icterus. Extraocular muscles intact.  HEENT: Head atraumatic, normocephalic. Oropharynx and nasopharynx clear.  NECK:  Supple, no jugular venous distention. No thyroid enlargement, no tenderness.  LUNGS: Normal breath sounds bilaterally, no wheezing, rales,rhonchi or crepitation. No use of accessory muscles of respiration.  CARDIOVASCULAR: S1, S2 normal. No murmurs, rubs, or gallops.  ABDOMEN: Soft, non-tender, non-distended. Bowel sounds present. No organomegaly or mass.  EXTREMITIES: No pedal edema, cyanosis, or clubbing.  NEUROLOGIC: Cranial nerves II through XII are intact. Muscle strength 5/5 in all extremities except 4/5 on right leg. Sensation intact. Gait not checked.  PSYCHIATRIC: The patient is alert and oriented x 3.  SKIN: No obvious rash, lesion, or ulcer.  DATA REVIEW:   CBC  Recent Labs Lab 02/17/17 1037  WBC 8.7  HGB 14.6  HCT 43.0  PLT 207    Chemistries   Recent Labs Lab 02/17/17 1037  NA 140  K 3.8  CL 105  CO2 24  GLUCOSE 172*  BUN 12  CREATININE 0.86  CALCIUM 9.5  AST 26  ALT 18  ALKPHOS 63  BILITOT 0.7     Microbiology Results  No results found for this or any previous visit.  RADIOLOGY:  No results found.   Management plans discussed with the patient, her daughter and they are in agreement.  CODE STATUS: Full Code   TOTAL TIME TAKING CARE OF THIS PATIENT: 33 minutes.    Demetrios Loll M.D on 02/19/2017 at 10:16 AM  Between 7am to 6pm - Pager - [phone]  After 6pm go to [email or web address] - Proofreader  [facility] [place]  Office  [phone]  CC: Primary care physician; Tracie Harrier, MD   Note: This dictation was prepared with Dragon dictation along with smaller phrase technology. Any transcriptional errors that result  from this process are unintentional.

## 2017-02-19 NOTE — Progress Notes (Signed)
Discharge instructions given and went over with patient at bedside. All questions answered. Madlyn Frankel, RN

## 2017-02-19 NOTE — Progress Notes (Signed)
Report called to Kim at [facility]. Awaiting transportation. Madlyn Frankel, RN

## 2017-03-05 NOTE — Progress Notes (Signed)
   02/18/17 1251  Acute Rehab OT Goals  Patient Stated Goal go home  OT Goal Formulation With patient/family  Time For Goal Achievement 03/04/17  Potential to Achieve Goals Good  OT Time Calculation  OT Start Time (ACUTE ONLY) 1046  OT Stop Time (ACUTE ONLY) 1122  OT Time Calculation (min) 36 min  OT G-codes **NOT FOR INPATIENT CLASS**  Functional Assessment Tool Used AM-PAC 6 Clicks Daily Activity  Functional Limitation Self care  Self Care Current Status (D8978) CJ  Self Care Goal Status (E7841) CJ  OT General Charges  $OT Visit 1 Procedure  OT Evaluation  $OT Eval Low Complexity 1 Procedure  OT Treatments  $Self Care/Home Management  23-37 mins   Late entry G codes following review of initial documentation.   Jeni Salles, MPH, MS, OTR/L ascom [phone] 03/05/17, 4:50 PM

## 2017-03-06 NOTE — Progress Notes (Signed)
   02/18/17 1409  PT Time Calculation  PT Start Time (ACUTE ONLY) 1320  PT Stop Time (ACUTE ONLY) 1341  PT Time Calculation (min) (ACUTE ONLY) 21 min  PT G-Codes **NOT FOR INPATIENT CLASS**  Functional Assessment Tool Used AM-PAC 6 Clicks Basic Mobility;Clinical judgement  Functional Limitation Mobility: Walking and moving around  Mobility: Walking and Moving Around Current Status (C3754) CJ  Mobility: Walking and Moving Around Goal Status (H6067) CI  PT General Charges  $$ ACUTE PT VISIT 1 Procedure  PT Evaluation  $PT Eval Low Complexity 1 Procedure   Late-entry g-codes added after review of initial documentation completed by Leitha Bleak, [place]. Owens Shark, PT, DPT, NCS 03/06/17, 12:18 PM [phone]

## 2017-04-28 ENCOUNTER — Encounter: Payer: Self-pay | Admitting: Emergency Medicine

## 2017-04-28 ENCOUNTER — Emergency Department: Payer: Medicare Other

## 2017-04-28 ENCOUNTER — Emergency Department
Admission: EM | Admit: 2017-04-28 | Discharge: 2017-04-28 | Disposition: A | Discharge status: Home / Self Care | Payer: Medicare Other | Attending: Emergency Medicine | Admitting: Emergency Medicine

## 2017-04-28 DIAGNOSIS — Z79899 Other long term (current) drug therapy: Secondary | ICD-10-CM | POA: Diagnosis not present

## 2017-04-28 DIAGNOSIS — Z7982 Long term (current) use of aspirin: Secondary | ICD-10-CM | POA: Insufficient documentation

## 2017-04-28 DIAGNOSIS — Z7984 Long term (current) use of oral hypoglycemic drugs: Secondary | ICD-10-CM | POA: Insufficient documentation

## 2017-04-28 DIAGNOSIS — Z7901 Long term (current) use of anticoagulants: Secondary | ICD-10-CM | POA: Diagnosis not present

## 2017-04-28 DIAGNOSIS — I10 Essential (primary) hypertension: Secondary | ICD-10-CM | POA: Diagnosis not present

## 2017-04-28 DIAGNOSIS — Z8673 Personal history of transient ischemic attack (TIA), and cerebral infarction without residual deficits: Secondary | ICD-10-CM | POA: Insufficient documentation

## 2017-04-28 DIAGNOSIS — N179 Acute kidney failure, unspecified: Secondary | ICD-10-CM | POA: Diagnosis not present

## 2017-04-28 DIAGNOSIS — E1165 Type 2 diabetes mellitus with hyperglycemia: Secondary | ICD-10-CM | POA: Diagnosis not present

## 2017-04-28 DIAGNOSIS — E860 Dehydration: Secondary | ICD-10-CM | POA: Insufficient documentation

## 2017-04-28 DIAGNOSIS — R739 Hyperglycemia, unspecified: Secondary | ICD-10-CM

## 2017-04-28 DIAGNOSIS — R42 Dizziness and giddiness: Secondary | ICD-10-CM | POA: Diagnosis present

## 2017-04-28 LAB — CBC
HCT: 41.6 % (ref 35.0–47.0)
Hemoglobin: 14.1 g/dL (ref 12.0–16.0)
MCH: 29.7 pg (ref 26.0–34.0)
MCHC: 33.9 g/dL (ref 32.0–36.0)
MCV: 87.6 fL (ref 80.0–100.0)
PLATELETS: 207 10*3/uL (ref 150–440)
RBC: 4.74 MIL/uL (ref 3.80–5.20)
RDW: 13.5 % (ref 11.5–14.5)
WBC: 9.3 10*3/uL (ref 3.6–11.0)

## 2017-04-28 LAB — DIFFERENTIAL
BASOS ABS: 0.1 10*3/uL (ref 0–0.1)
BASOS PCT: 1 %
Eosinophils Absolute: 0.3 10*3/uL (ref 0–0.7)
Eosinophils Relative: 4 %
LYMPHS PCT: 31 %
Lymphs Abs: 2.9 10*3/uL (ref 1.0–3.6)
MONO ABS: 0.8 10*3/uL (ref 0.2–0.9)
Monocytes Relative: 9 %
NEUTROS ABS: 5.2 10*3/uL (ref 1.4–6.5)
Neutrophils Relative %: 55 %

## 2017-04-28 LAB — URINALYSIS, COMPLETE (UACMP) WITH MICROSCOPIC
BILIRUBIN URINE: NEGATIVE
Bacteria, UA: NONE SEEN
Glucose, UA: NEGATIVE mg/dL
HGB URINE DIPSTICK: NEGATIVE
KETONES UR: NEGATIVE mg/dL
LEUKOCYTES UA: NEGATIVE
Nitrite: NEGATIVE
Protein, ur: NEGATIVE mg/dL
SPECIFIC GRAVITY, URINE: 1.009 (ref 1.005–1.030)
pH: 5 (ref 5.0–8.0)

## 2017-04-28 LAB — GLUCOSE, CAPILLARY: Glucose-Capillary: 178 mg/dL — ABNORMAL HIGH (ref 65–99)

## 2017-04-28 LAB — COMPREHENSIVE METABOLIC PANEL
ALBUMIN: 4.1 g/dL (ref 3.5–5.0)
ALK PHOS: 60 U/L (ref 38–126)
ALT: 21 U/L (ref 14–54)
AST: 29 U/L (ref 15–41)
Anion gap: 11 (ref 5–15)
BUN: 25 mg/dL — AB (ref 6–20)
CALCIUM: 9.6 mg/dL (ref 8.9–10.3)
CHLORIDE: 104 mmol/L (ref 101–111)
CO2: 24 mmol/L (ref 22–32)
CREATININE: 1.15 mg/dL — AB (ref 0.44–1.00)
GFR calc Af Amer: 53 mL/min — ABNORMAL LOW (ref >60)
GFR calc non Af Amer: 45 mL/min — ABNORMAL LOW (ref >60)
GLUCOSE: 182 mg/dL — AB (ref 65–99)
Potassium: 4 mmol/L (ref 3.5–5.1)
SODIUM: 139 mmol/L (ref 135–145)
Total Bilirubin: 0.5 mg/dL (ref 0.3–1.2)
Total Protein: 7.1 g/dL (ref 6.5–8.1)

## 2017-04-28 LAB — APTT: APTT: 27 s (ref 24–36)

## 2017-04-28 LAB — PROTIME-INR
INR: 0.97
Prothrombin Time: 12.9 seconds (ref 11.4–15.2)

## 2017-04-28 LAB — TROPONIN I: Troponin I: <0.03 ng/mL (ref <0.03)

## 2017-04-28 MED ORDER — SODIUM CHLORIDE 0.9 % IV BOLUS (SEPSIS)
1000.0000 mL | Freq: Once | INTRAVENOUS | Status: AC
  Administered 2017-04-28: 1000 mL via INTRAVENOUS

## 2017-04-28 NOTE — Discharge Instructions (Signed)
Today you were treated for dehydration.   Return to the emergency room immediately for any worsening symptoms including confusion altered mental status, dizziness or passing out, fever, slurred speech, weakness, numbness or any other symptoms concerning to you.

## 2017-04-28 NOTE — ED Notes (Signed)
Pt discharged to home.  Family member driving.  Discharge instructions reviewed.  Verbalized understanding.  No questions or concerns at this time.  Teach back verified.  Pt in NAD.  No items left in ED.   

## 2017-04-28 NOTE — ED Notes (Signed)
Unable to get patient to sign discharge, signature pad not working.  Pt verbalized understanding.

## 2017-04-28 NOTE — ED Provider Notes (Signed)
[facility] Emergency Department Provider Note ____________________________________________   I have reviewed the triage vital signs and the triage nursing note.  HISTORY  Chief Complaint Lightheaded and Nausea   Historian Patient  HPI Kimberly Duarte is a 76 y.o. female with history of prior stroke that affected her with right-sided sensory changes, as well as a history of diabetes, presents today stating that yesterday she had several hours where she didn't quite feel right. Denied any focal weakness or numbness. States that she felt all better in the evening and then this morning she was feeling better until this afternoon she was feeling like she was a little "off." Again no focal weakness or numbness or slurred speech. No fever. No nausea. No chest pain or trouble breathing.      Past Medical History:  Diagnosis Date  . Depression   . Diabetes mellitus without complication ([place])   . Diabetic peripheral neuropathy ([place])   . GERD (gastroesophageal reflux disease)   . Hyperlipidemia   . Hypertension   . Obesity   . Stroke ([place])   . Uterine cancer [facility])     Patient Active Problem List   Diagnosis Date Noted  . CVA (cerebral vascular accident) ([place]) 11/27/2016    Past Surgical History:  Procedure Laterality Date  . ABDOMINAL HYSTERECTOMY    . COLONOSCOPY    . COLONOSCOPY WITH PROPOFOL N/A 09/04/2015   Procedure: COLONOSCOPY WITH PROPOFOL;  Surgeon: Manya Silvas, MD;  Location: [facility] ENDOSCOPY;  Service: Endoscopy;  Laterality: N/A;  . ESOPHAGOGASTRODUODENOSCOPY (EGD) WITH PROPOFOL N/A 09/04/2015   Procedure: ESOPHAGOGASTRODUODENOSCOPY (EGD) WITH PROPOFOL;  Surgeon: Manya Silvas, MD;  Location: [facility] ENDOSCOPY;  Service: Endoscopy;  Laterality: N/A;  . UPPER GI ENDOSCOPY      Prior to Admission medications   Medication Sig Start Date End Date Taking? Authorizing Provider  aspirin 81 MG chewable tablet Chew 1 tablet (81 mg total) by mouth  daily. 02/16/17   Bettey Costa, MD  atorvastatin (LIPITOR) 40 MG tablet Take 1 tablet (40 mg total) by mouth daily at 6 PM. 11/27/16   Hillary Bow, MD  citalopram (CELEXA) 20 MG tablet Take 20 mg by mouth daily.    [provider]  clopidogrel (PLAVIX) 75 MG tablet Take 1 tablet (75 mg total) by mouth daily. 02/16/17   Bettey Costa, MD  Exenatide ER 2 MG/0.85ML AUIJ Inject 2 mg into the skin once a week.    [provider]  glipiZIDE (GLUCOTROL) 10 MG tablet Take 10 mg by mouth 2 (two) times daily.     [provider]  hydrochlorothiazide (MICROZIDE) 12.5 MG capsule Take 12.5 mg by mouth daily.    [provider]  insulin NPH Human (HUMULIN N,NOVOLIN N) 100 UNIT/ML injection Inject 15-30 Units into the skin 3 (three) times daily. tk 30 units qam and 30 units qhs, and if sugar is high during the afternoon, tk an additional 15 units    [provider]  lisinopril (PRINIVIL,ZESTRIL) 20 MG tablet Take 20 mg by mouth daily.    [provider]  meloxicam (MOBIC) 15 MG tablet Take 15 mg by mouth daily.    [provider]  metFORMIN (GLUCOPHAGE) 1000 MG tablet Take 1,000 mg by mouth 2 (two) times daily with a meal.    [provider]  omeprazole (PRILOSEC) 20 MG capsule Take 20 mg by mouth daily as needed.     [provider]  pregabalin (LYRICA) 225 MG capsule Take  225 mg by mouth 2 (two) times daily.    [provider]  rOPINIRole (REQUIP) 0.5 MG tablet Take 0.5 mg by mouth every evening.    [provider]  Vitamin D, Ergocalciferol, (DRISDOL) 50000 UNITS CAPS capsule Take 50,000 Units by mouth every 7 (seven) days.    [provider]    Allergies  Allergen Reactions  . Actos [Pioglitazone] Other (See Comments)    Reaction:  Stomach cramps   . Byetta 10 Mcg Pen [Exenatide] Nausea And Vomiting  . Victoza [Liraglutide] Other (See Comments)    Reaction:  Burning and stinging of feet       Family History  Problem Relation Age of Onset  . Breast cancer Sister 8  . Colon cancer Mother   . Heart disease Father     Social History Social History  Substance Use Topics  . Smoking status: Never Smoker  . Smokeless tobacco: Never Used  . Alcohol use No    Review of Systems  Constitutional: Negative for fever. Eyes: Negative for visual changes. ENT: Negative for sore throat. Cardiovascular: Negative for chest pain. Respiratory: Negative for shortness of breath. Gastrointestinal: Negative for abdominal pain, vomiting and diarrhea. Genitourinary: Negative for dysuria. Musculoskeletal: Negative for back pain. Skin: Negative for rash. Neurological: Negative for headache.  ____________________________________________   PHYSICAL EXAM:  VITAL SIGNS: ED Triage Vitals  Enc Vitals Group     BP 04/28/17 1654 (!) 138/122     Pulse Rate 04/28/17 1654 79     Resp 04/28/17 1654 18     Temp 04/28/17 1654 98.2 F (36.8 C)     Temp Source 04/28/17 1654 Oral     SpO2 04/28/17 1654 96 %     Weight 04/28/17 1658 190 lb (86.2 kg)     Height 04/28/17 1658 5\' 3"  (1.6 m)     Head Circumference --      Peak Flow --      Pain Score --      Pain Loc --      Pain Edu? --      Excl. in [place]? --      Constitutional: Alert and oriented. Well appearing and in no distress. HEENT   Head: Normocephalic and atraumatic.      Eyes: Conjunctivae are normal. Pupils equal and round.       Ears:         Nose: No congestion/rhinnorhea.   Mouth/Throat: Mucous membranes are Very dry.   Neck: No stridor. Cardiovascular/Chest: Normal rate, regular rhythm.  No murmurs, rubs, or gallops. Respiratory: Normal respiratory effort without tachypnea nor retractions. Breath sounds are clear and equal bilaterally. No wheezes/rales/rhonchi. Gastrointestinal: Soft. No distention, no guarding, no rebound. Nontender.    Genitourinary/rectal:Deferred Musculoskeletal: Nontender with normal range  of motion in all extremities. No joint effusions.  No lower extremity tenderness.  No edema. Neurologic:  Normal speech and language. No gross or focal neurologic deficits are appreciated. Skin:  Skin is warm, dry and intact. No rash noted. Psychiatric: Mood and affect are normal. Speech and behavior are normal. Patient exhibits appropriate insight and judgment.   ____________________________________________  LABS (pertinent positives/negatives)  Labs Reviewed  COMPREHENSIVE METABOLIC PANEL - Abnormal; Notable for the following:       Result Value   Glucose, Bld 182 (*)    BUN 25 (*)    Creatinine, Ser 1.15 (*)    GFR calc non Af Amer 45 (*)    GFR calc Af Amer 53 (*)  All other components within normal limits  GLUCOSE, CAPILLARY - Abnormal; Notable for the following:    Glucose-Capillary 178 (*)    All other components within normal limits  URINALYSIS, COMPLETE (UACMP) WITH MICROSCOPIC - Abnormal; Notable for the following:    Color, Urine STRAW (*)    APPearance CLEAR (*)    Squamous Epithelial / LPF 0-5 (*)    All other components within normal limits  PROTIME-INR  APTT  CBC  DIFFERENTIAL  TROPONIN I  CBG MONITORING, ED    ____________________________________________    EKG I, Lisa Roca, MD, the attending physician have personally viewed and interpreted all ECGs.  83 bpm. Normal sinus rhythm. Narrow QRS. Normal axis. Nonspecific ST and T-wave ____________________________________________  RADIOLOGY All Xrays were viewed by me. Imaging interpreted by Radiologist.  CT head without contrast:  IMPRESSION: 1. No acute intracranial findings. 2. No change from comparison MRI __________________________________________  PROCEDURES  Procedure(s) performed: None  Critical Care performed: None  ____________________________________________   ED COURSE / ASSESSMENT AND PLAN  Pertinent labs & imaging results that were available during my care of the patient  were reviewed by me and considered in my medical decision making (see chart for details).   Ms. Nazzaro is overall well-appearing but she has a very dry mouth. On laboratory evaluation she has a acute kidney injury, and I'm going to give her fluids for suspected dehydration.  Her complaint is pretty vague, stating that she just doesn't feel quite right. I suspect this may be due to dehydration.  Head CT was obtained and was reassuring for no focal findings. Symptoms on exam are not consistent with focal neurologic deficit on the highly suspicious for stroke or TIA.  Awaiting urinalysis.   Reassuring. On reevaluation around 9:45 PM, patient much more alert and interactive. Daughters are here with her. I suspect patient's symptoms today are due to dehydration. We discussed staying well-hydrated.    CONSULTATIONS:  None Patient / Family / Caregiver informed of clinical course, medical decision-making process, and agree with plan.   I discussed return precautions, follow-up instructions, and discharge instructions with patient and/or family.  Discharge Instructions : Today you were treated for dehydration.   Return to the emergency room immediately for any worsening symptoms including confusion altered mental status, dizziness or passing out, fever, slurred speech, weakness, numbness or any other symptoms concerning to you.  ___________________________________________   FINAL CLINICAL IMPRESSION(S) / ED DIAGNOSES   Final diagnoses:  AKI (acute kidney injury) ([place])  Hyperglycemia  Dehydration              Note: This dictation was prepared with Dragon dictation. Any transcriptional errors that result from this process are unintentional    Lisa Roca, MD 04/28/17 2209

## 2017-04-28 NOTE — ED Triage Notes (Signed)
Pt son reports yesterday pt had trouble remembering things. Pt reports that got better. Pt states today she has felt distant and foggy and felt off balance. Pt reports noticing feeling this way today at 1230. Pt reports low blood sugar. Pt speech clear in triage. Pt alert and oriented in triage. Bilateral strong hand grips noted. Facial symmetry intact.

## 2017-05-12 ENCOUNTER — Other Ambulatory Visit: Payer: Self-pay | Admitting: Neurology

## 2017-05-12 DIAGNOSIS — R404 Transient alteration of awareness: Secondary | ICD-10-CM

## 2017-05-16 ENCOUNTER — Other Ambulatory Visit: Payer: Self-pay

## 2017-05-16 ENCOUNTER — Encounter (HOSPITAL_COMMUNITY): Payer: Self-pay | Admitting: *Deleted

## 2017-05-16 ENCOUNTER — Observation Stay (HOSPITAL_COMMUNITY)
Admission: EM | Admit: 2017-05-16 | Discharge: 2017-05-18 | Disposition: A | Discharge status: Home / Self Care | Payer: Medicare Other | Attending: Internal Medicine | Admitting: Internal Medicine

## 2017-05-16 ENCOUNTER — Ambulatory Visit
Admission: RE | Admit: 2017-05-16 | Discharge: 2017-05-16 | Disposition: A | Discharge status: Home / Self Care | Payer: Medicare Other | Source: Ambulatory Visit | Attending: Neurology | Admitting: Neurology

## 2017-05-16 ENCOUNTER — Observation Stay (HOSPITAL_COMMUNITY): Payer: Medicare Other

## 2017-05-16 DIAGNOSIS — I639 Cerebral infarction, unspecified: Principal | ICD-10-CM | POA: Diagnosis present

## 2017-05-16 DIAGNOSIS — I63532 Cerebral infarction due to unspecified occlusion or stenosis of left posterior cerebral artery: Secondary | ICD-10-CM | POA: Diagnosis not present

## 2017-05-16 DIAGNOSIS — Z79899 Other long term (current) drug therapy: Secondary | ICD-10-CM | POA: Insufficient documentation

## 2017-05-16 DIAGNOSIS — Z7902 Long term (current) use of antithrombotics/antiplatelets: Secondary | ICD-10-CM | POA: Diagnosis not present

## 2017-05-16 DIAGNOSIS — E785 Hyperlipidemia, unspecified: Secondary | ICD-10-CM | POA: Diagnosis not present

## 2017-05-16 DIAGNOSIS — Z791 Long term (current) use of non-steroidal anti-inflammatories (NSAID): Secondary | ICD-10-CM | POA: Insufficient documentation

## 2017-05-16 DIAGNOSIS — F329 Major depressive disorder, single episode, unspecified: Secondary | ICD-10-CM | POA: Diagnosis not present

## 2017-05-16 DIAGNOSIS — I638 Other cerebral infarction: Secondary | ICD-10-CM | POA: Insufficient documentation

## 2017-05-16 DIAGNOSIS — I700 Atherosclerosis of aorta: Secondary | ICD-10-CM | POA: Diagnosis not present

## 2017-05-16 DIAGNOSIS — R404 Transient alteration of awareness: Secondary | ICD-10-CM | POA: Insufficient documentation

## 2017-05-16 DIAGNOSIS — E1142 Type 2 diabetes mellitus with diabetic polyneuropathy: Secondary | ICD-10-CM | POA: Insufficient documentation

## 2017-05-16 DIAGNOSIS — Z794 Long term (current) use of insulin: Secondary | ICD-10-CM

## 2017-05-16 DIAGNOSIS — E669 Obesity, unspecified: Secondary | ICD-10-CM | POA: Diagnosis not present

## 2017-05-16 DIAGNOSIS — I10 Essential (primary) hypertension: Secondary | ICD-10-CM | POA: Diagnosis not present

## 2017-05-16 DIAGNOSIS — E1159 Type 2 diabetes mellitus with other circulatory complications: Secondary | ICD-10-CM

## 2017-05-16 DIAGNOSIS — Z7982 Long term (current) use of aspirin: Secondary | ICD-10-CM | POA: Diagnosis not present

## 2017-05-16 DIAGNOSIS — Z6832 Body mass index (BMI) 32.0-32.9, adult: Secondary | ICD-10-CM | POA: Insufficient documentation

## 2017-05-16 DIAGNOSIS — K219 Gastro-esophageal reflux disease without esophagitis: Secondary | ICD-10-CM | POA: Diagnosis not present

## 2017-05-16 DIAGNOSIS — I6522 Occlusion and stenosis of left carotid artery: Secondary | ICD-10-CM | POA: Diagnosis not present

## 2017-05-16 DIAGNOSIS — E119 Type 2 diabetes mellitus without complications: Secondary | ICD-10-CM

## 2017-05-16 LAB — DIFFERENTIAL
Basophils Absolute: 0 10*3/uL (ref 0.0–0.1)
Basophils Relative: 0 %
EOS PCT: 4 %
Eosinophils Absolute: 0.3 10*3/uL (ref 0.0–0.7)
LYMPHS PCT: 33 %
Lymphs Abs: 2.7 10*3/uL (ref 0.7–4.0)
MONO ABS: 0.6 10*3/uL (ref 0.1–1.0)
Monocytes Relative: 8 %
Neutro Abs: 4.6 10*3/uL (ref 1.7–7.7)
Neutrophils Relative %: 55 %

## 2017-05-16 LAB — COMPREHENSIVE METABOLIC PANEL
ALBUMIN: 4 g/dL (ref 3.5–5.0)
ALK PHOS: 60 U/L (ref 38–126)
ALT: 19 U/L (ref 14–54)
AST: 19 U/L (ref 15–41)
Anion gap: 10 (ref 5–15)
BILIRUBIN TOTAL: 0.8 mg/dL (ref 0.3–1.2)
BUN: 19 mg/dL (ref 6–20)
CALCIUM: 9.5 mg/dL (ref 8.9–10.3)
CO2: 25 mmol/L (ref 22–32)
CREATININE: 1.06 mg/dL — AB (ref 0.44–1.00)
Chloride: 103 mmol/L (ref 101–111)
GFR calc non Af Amer: 50 mL/min — ABNORMAL LOW (ref >60)
GFR, EST AFRICAN AMERICAN: 58 mL/min — AB (ref >60)
GLUCOSE: 211 mg/dL — AB (ref 65–99)
Potassium: 4 mmol/L (ref 3.5–5.1)
SODIUM: 138 mmol/L (ref 135–145)
TOTAL PROTEIN: 7.1 g/dL (ref 6.5–8.1)

## 2017-05-16 LAB — GLUCOSE, CAPILLARY: Glucose-Capillary: 142 mg/dL — ABNORMAL HIGH (ref 65–99)

## 2017-05-16 LAB — I-STAT CHEM 8, ED
BUN: 25 mg/dL — ABNORMAL HIGH (ref 6–20)
CALCIUM ION: 1.2 mmol/L (ref 1.15–1.40)
Chloride: 101 mmol/L (ref 101–111)
Creatinine, Ser: 1 mg/dL (ref 0.44–1.00)
GLUCOSE: 209 mg/dL — AB (ref 65–99)
HCT: 41 % (ref 36.0–46.0)
HEMOGLOBIN: 13.9 g/dL (ref 12.0–15.0)
POTASSIUM: 3.9 mmol/L (ref 3.5–5.1)
Sodium: 140 mmol/L (ref 135–145)
TCO2: 29 mmol/L (ref 0–100)

## 2017-05-16 LAB — CBC
HCT: 40 % (ref 36.0–46.0)
Hemoglobin: 13.8 g/dL (ref 12.0–15.0)
MCH: 29.5 pg (ref 26.0–34.0)
MCHC: 34.5 g/dL (ref 30.0–36.0)
MCV: 85.5 fL (ref 78.0–100.0)
PLATELETS: 216 10*3/uL (ref 150–400)
RBC: 4.68 MIL/uL (ref 3.87–5.11)
RDW: 13.7 % (ref 11.5–15.5)
WBC: 8.3 10*3/uL (ref 4.0–10.5)

## 2017-05-16 LAB — PROTIME-INR
INR: 0.95
PROTHROMBIN TIME: 12.6 s (ref 11.4–15.2)

## 2017-05-16 LAB — APTT: aPTT: 22 seconds — ABNORMAL LOW (ref 24–36)

## 2017-05-16 LAB — I-STAT TROPONIN, ED: Troponin i, poc: 0 ng/mL (ref 0.00–0.08)

## 2017-05-16 MED ORDER — ACETAMINOPHEN 160 MG/5ML PO SOLN
650.0000 mg | ORAL | Status: DC | PRN
  Administered 2017-05-17: 650 mg

## 2017-05-16 MED ORDER — STROKE: EARLY STAGES OF RECOVERY BOOK
Freq: Once | Status: AC
Start: 2017-05-16 — End: 2017-05-16
  Administered 2017-05-16

## 2017-05-16 MED ORDER — ROPINIROLE HCL 1 MG PO TABS
0.5000 mg | ORAL_TABLET | Freq: Every evening | ORAL | Status: DC
  Administered 2017-05-16 – 2017-05-17 (×2): 0.5 mg via ORAL
  Filled 2017-05-16: qty 1

## 2017-05-16 MED ORDER — INSULIN LISPRO 100 UNIT/ML (KWIKPEN): 10.0000 [IU] | PEN_INJECTOR | Freq: Three times a day (TID) | SUBCUTANEOUS | Status: DC

## 2017-05-16 MED ORDER — ACETAMINOPHEN 650 MG RE SUPP: 650.0000 mg | RECTAL | Status: DC | PRN

## 2017-05-16 MED ORDER — ACETAMINOPHEN 325 MG PO TABS
650.0000 mg | ORAL_TABLET | ORAL | Status: DC | PRN
  Administered 2017-05-17: 650 mg via ORAL
  Filled 2017-05-16 (×2): qty 2

## 2017-05-16 MED ORDER — INSULIN ASPART 100 UNIT/ML ~~LOC~~ SOLN
0.0000 [IU] | Freq: Three times a day (TID) | SUBCUTANEOUS | Status: DC
  Administered 2017-05-17: 3 [IU] via SUBCUTANEOUS
  Administered 2017-05-17 – 2017-05-18 (×2): 2 [IU] via SUBCUTANEOUS

## 2017-05-16 MED ORDER — ATORVASTATIN CALCIUM 80 MG PO TABS
80.0000 mg | ORAL_TABLET | Freq: Every day | ORAL | Status: DC
  Administered 2017-05-16 – 2017-05-17 (×2): 80 mg via ORAL
  Filled 2017-05-16 (×2): qty 1

## 2017-05-16 MED ORDER — SENNOSIDES-DOCUSATE SODIUM 8.6-50 MG PO TABS: 1.0000 | ORAL_TABLET | Freq: Every evening | ORAL | Status: DC | PRN

## 2017-05-16 MED ORDER — CLOPIDOGREL BISULFATE 75 MG PO TABS
75.0000 mg | ORAL_TABLET | Freq: Every day | ORAL | Status: DC
  Administered 2017-05-17 – 2017-05-18 (×2): 75 mg via ORAL
  Filled 2017-05-16 (×2): qty 1

## 2017-05-16 MED ORDER — CITALOPRAM HYDROBROMIDE 10 MG PO TABS
20.0000 mg | ORAL_TABLET | Freq: Every day | ORAL | Status: DC
  Administered 2017-05-17 – 2017-05-18 (×2): 20 mg via ORAL
  Filled 2017-05-16 (×2): qty 2

## 2017-05-16 MED ORDER — INSULIN NPH (HUMAN) (ISOPHANE) 100 UNIT/ML ~~LOC~~ SUSP
10.0000 [IU] | Freq: Two times a day (BID) | SUBCUTANEOUS | Status: DC
  Administered 2017-05-16 – 2017-05-18 (×4): 10 [IU] via SUBCUTANEOUS
  Filled 2017-05-16: qty 10

## 2017-05-16 MED ORDER — LISINOPRIL 20 MG PO TABS
20.0000 mg | ORAL_TABLET | Freq: Every day | ORAL | Status: DC
  Administered 2017-05-17: 20 mg via ORAL
  Filled 2017-05-16: qty 1

## 2017-05-16 MED ORDER — INSULIN ASPART 100 UNIT/ML ~~LOC~~ SOLN
10.0000 [IU] | Freq: Three times a day (TID) | SUBCUTANEOUS | Status: DC
  Administered 2017-05-17 – 2017-05-18 (×4): 10 [IU] via SUBCUTANEOUS

## 2017-05-16 MED ORDER — VITAMIN D (ERGOCALCIFEROL) 1.25 MG (50000 UNIT) PO CAPS
50000.0000 [IU] | ORAL_CAPSULE | ORAL | Status: DC
  Administered 2017-05-18: 50000 [IU] via ORAL
  Filled 2017-05-16: qty 1

## 2017-05-16 MED ORDER — ASPIRIN 325 MG PO TABS
325.0000 mg | ORAL_TABLET | Freq: Once | ORAL | Status: AC
  Administered 2017-05-16: 325 mg via ORAL
  Filled 2017-05-16: qty 1

## 2017-05-16 MED ORDER — IOPAMIDOL (ISOVUE-370) INJECTION 76%
INTRAVENOUS | Status: AC
  Administered 2017-05-16: 50 mL
  Filled 2017-05-16: qty 50

## 2017-05-16 MED ORDER — PREGABALIN 75 MG PO CAPS
150.0000 mg | ORAL_CAPSULE | Freq: Two times a day (BID) | ORAL | Status: DC
  Administered 2017-05-16 – 2017-05-18 (×4): 150 mg via ORAL
  Filled 2017-05-16 (×4): qty 2

## 2017-05-16 MED ORDER — HYDROCHLOROTHIAZIDE 12.5 MG PO CAPS
12.5000 mg | ORAL_CAPSULE | Freq: Every day | ORAL | Status: DC
  Administered 2017-05-17: 12.5 mg via ORAL
  Filled 2017-05-16: qty 1

## 2017-05-16 MED ORDER — ENOXAPARIN SODIUM 40 MG/0.4ML ~~LOC~~ SOLN
40.0000 mg | SUBCUTANEOUS | Status: DC
  Administered 2017-05-16 – 2017-05-17 (×2): 40 mg via SUBCUTANEOUS
  Filled 2017-05-16 (×2): qty 0.4

## 2017-05-16 MED ORDER — PANTOPRAZOLE SODIUM 40 MG PO TBEC: 40.0000 mg | DELAYED_RELEASE_TABLET | Freq: Every day | ORAL | Status: DC | PRN

## 2017-05-16 NOTE — ED Triage Notes (Signed)
Pt here for eval, pt hx of CVA in June per family,  Pt had syncopal episode x 2 wks ago & was evaluated at [facility] for that episode & treated dehydration, pts daughter reports having increased R sided weakness since syncopal episode, pt had EEG completed this week & had an MRI today at [facility], pts neurologist Brigitte Pulse, MD called the pt and told the pt to come here for eval, pt A&O x4, Darl Householder, MD speaking with pt in triage

## 2017-05-16 NOTE — Consult Note (Signed)
Requesting Physician: Dr. Tomi Bamberger    Chief Complaint: Stroke on MRI   History obtained from:  Patient   HPI:                                                                                                                                         Kimberly Duarte is an 76 y.o. female  With HTN, DM and stroke in June 2018 ( left thalamic) who present with confusion, imbalance about 2 weeks ago ( see nurses note in 7/30). She was seen by her outpatient neurologist who ordered an MRI head and EEG. EEG was apparently negative however MRI shows multiple acute infarcts.  Date last known well: about 2 weeks ago Time last known well: about 2 weeks ago  tPA Given:No    Past Medical History:  Diagnosis Date  . Depression   . Diabetes mellitus without complication ([place])   . Diabetic peripheral neuropathy ([place])   . GERD (gastroesophageal reflux disease)   . Hyperlipidemia   . Hypertension   . Obesity   . Stroke ([place])   . Uterine cancer [facility])     Past Surgical History:  Procedure Laterality Date  . ABDOMINAL HYSTERECTOMY    . COLONOSCOPY    . COLONOSCOPY WITH PROPOFOL N/A 09/04/2015   Procedure: COLONOSCOPY WITH PROPOFOL;  Surgeon: Manya Silvas, MD;  Location: [facility] ENDOSCOPY;  Service: Endoscopy;  Laterality: N/A;  . ESOPHAGOGASTRODUODENOSCOPY (EGD) WITH PROPOFOL N/A 09/04/2015   Procedure: ESOPHAGOGASTRODUODENOSCOPY (EGD) WITH PROPOFOL;  Surgeon: Manya Silvas, MD;  Location: [facility] ENDOSCOPY;  Service: Endoscopy;  Laterality: N/A;  . UPPER GI ENDOSCOPY      Family History  Problem Relation Age of Onset  . Breast cancer Sister 85  . Colon cancer Mother   . Heart disease Father    Social History:  reports that she has never smoked. She has never used smokeless tobacco. She reports that she does not drink alcohol or use drugs.  Allergies:  Allergies  Allergen Reactions  . Actos [Pioglitazone] Other (See Comments)    Was unable to tolerate - caused stomach cramps Reaction:   Stomach cramps   . Byetta 10 Mcg Pen [Exenatide] Nausea And Vomiting and Other (See Comments)    Was unable to tolerate - made her sick  . Victoza [Liraglutide] Other (See Comments)    caused her feet to burn and sting and stopped it Reaction:  Burning and stinging of feet      Medications:  Reviewed records  ROS:                                                                                                                                       14 point ROS negative  Mental Status: Alert, oriented, thought content appropriate.  Speech fluent without evidence of aphasia.  Able to follow 3 step commands without difficulty. Has some difficulty with attention. Normal repetition.  Cranial Nerves: II: visual fields grossly normal, pupils equal, round, reactive to light and accommodation III,IV, VI: ptosis not present, extraocular muscles extra-ocular motions intact bilaterally V,VII: mild right NFLL VIII: hearing normal bilaterally IX,X: gag reflex present XI: trapezius strength/neck flexion strength normal bilaterally XII: tongue strength normal  Motor: 5/5 in all 4 extremities, Right leg weakness due to pain overcome with effort Deep Tendon Reflexes: 2+ bilaterally Plantars: Right: downgoing   Left: downgoing Cerebellar: normal finger-to-nose, normal rapid alternating movements and normal heel-to-shin test normal gait and station     Lab Results: Basic Metabolic Panel:  Recent Labs Lab 05/16/17 1750 05/16/17 1838  NA 138 140  K 4.0 3.9  CL 103 101  CO2 25  --   GLUCOSE 211* 209*  BUN 19 25*  CREATININE 1.06* 1.00  CALCIUM 9.5  --     Liver Function Tests:  Recent Labs Lab 05/16/17 1750  AST 19  ALT 19  ALKPHOS 60  BILITOT 0.8  PROT 7.1  ALBUMIN 4.0   No results for input(s): LIPASE, AMYLASE in the last 168 hours. No results for  input(s): AMMONIA in the last 168 hours.  CBC:  Recent Labs Lab 05/16/17 1750 05/16/17 1838  WBC 8.3  --   NEUTROABS 4.6  --   HGB 13.8 13.9  HCT 40.0 41.0  MCV 85.5  --   PLT 216  --     Cardiac Enzymes: No results for input(s): CKTOTAL, CKMB, CKMBINDEX, TROPONINI in the last 168 hours.  Lipid Panel: No results for input(s): CHOL, TRIG, HDL, CHOLHDL, VLDL, LDLCALC in the last 168 hours.  CBG:  Recent Labs Lab 05/16/17 2115  GLUCAP 142*    Microbiology: No results found for this or any previous visit.  Coagulation Studies:  Recent Labs  05/16/17 1750  LABPROT 12.6  INR 0.95    Imaging: Ct Angio Head W Or Wo Contrast  Result Date: 05/16/2017 CLINICAL DATA:  New infarction in the left PCA territory. EXAM: CT ANGIOGRAPHY HEAD AND NECK TECHNIQUE: Multidetector CT imaging of the head and neck was performed using the standard protocol during bolus administration of intravenous contrast. Multiplanar CT image reconstructions and MIPs were obtained to evaluate the vascular anatomy. Carotid stenosis measurements (when applicable) are obtained utilizing NASCET criteria, using the distal internal carotid diameter as the denominator. CONTRAST:  50 cc Isovue 370 COMPARISON:  MRI same day FINDINGS: CT HEAD FINDINGS Brain: Subtle evidence of low-density in the left PCA territory as  shown on the MRI. Old infarction in the left lateral thalamus. No sign of mass lesion, hemorrhage, hydrocephalus or extra-axial collection. Vascular: Negative Skull: Negative Sinuses: Clear Orbits: Negative Review of the MIP images confirms the above findings CTA NECK FINDINGS Aortic arch: Aortic atherosclerosis. No aneurysm or dissection. Branching pattern of the brachiocephalic vessels from the arch is normal. Right carotid system: Common carotid artery widely patent to the bifurcation region. Mild atherosclerotic disease at the carotid bifurcation and ICA bulb but no stenosis or irregularity. Cervical ICA  widely patent. Left carotid system: Left common carotid artery widely patent to the bifurcation region. More extensive atherosclerotic disease at the left carotid bifurcation and proximal ICA bulb. Minimal diameter of the proximal ICA measures 3.5 mm. Compared to a more distal cervical ICA diameter of 5 mm, this indicates a 30% stenosis. Cervical ICA widely patent beyond that. Vertebral arteries: Right vertebral artery is dominant. The origin is widely patent. The right vertebral artery is widely patent through the cervical region without stenosis or irregularity. Left vertebral artery is non dominant. The vertebral artery origin is widely patent in the vessel is widely patent through the cervical region to the foramen magnum. Skeleton: Ordinary spondylosis and facet osteoarthritis. Other neck: No mass or lymphadenopathy. Upper chest: Negative. Review of the MIP images confirms the above findings CTA HEAD FINDINGS Anterior circulation: Both internal carotid arteries are patent through the skullbase and siphon regions. There is peripheral calcification in the carotid siphon regions but no stenosis more than 30-40%. Anterior and middle cerebral vessels are patent without proximal stenosis, aneurysm or vascular malformation. Patent posterior communicating arteries bilaterally. Posterior circulation: Both vertebral arteries are patent at the foramen magnum. Both posterior inferior cerebellar arteries are patent. Both vertebral arteries give supply to the basilar. There is no basilar stenosis. Superior cerebellar arteries are patent bilaterally. Right PCA is patent. Left PCA is occluded 9 mm beyond its origin. Venous sinuses: Patent and normal. Anatomic variants: None significant Delayed phase: No abnormal enhancement Review of the MIP images confirms the above findings IMPRESSION: Aortic atherosclerosis without pronounced irregularity, aneurysm or dissection. Atherosclerotic disease at both carotid bifurcations. No  stenosis on the right. 30-40% stenosis of the proximal ICA on the left. Vertebral artery origins are widely patent and both vertebral arteries are widely patent to the basilar. There is occlusion of the left PCA 9 mm beyond its origin. The posterior cerebral arteries receive contribution from the basilar tip but also from bilateral small posterior communicating arteries which are patent. Therefore, occlusion of the left PCA could be due to embolic disease from the anterior or posterior circulation. Electronically Signed   By: Nelson Chimes M.D.   On: 05/16/2017 20:16   Ct Angio Neck W And/or Wo Contrast  Result Date: 05/16/2017 CLINICAL DATA:  New infarction in the left PCA territory. EXAM: CT ANGIOGRAPHY HEAD AND NECK TECHNIQUE: Multidetector CT imaging of the head and neck was performed using the standard protocol during bolus administration of intravenous contrast. Multiplanar CT image reconstructions and MIPs were obtained to evaluate the vascular anatomy. Carotid stenosis measurements (when applicable) are obtained utilizing NASCET criteria, using the distal internal carotid diameter as the denominator. CONTRAST:  50 cc Isovue 370 COMPARISON:  MRI same day FINDINGS: CT HEAD FINDINGS Brain: Subtle evidence of low-density in the left PCA territory as shown on the MRI. Old infarction in the left lateral thalamus. No sign of mass lesion, hemorrhage, hydrocephalus or extra-axial collection. Vascular: Negative Skull: Negative Sinuses: Clear Orbits: Negative Review  of the MIP images confirms the above findings CTA NECK FINDINGS Aortic arch: Aortic atherosclerosis. No aneurysm or dissection. Branching pattern of the brachiocephalic vessels from the arch is normal. Right carotid system: Common carotid artery widely patent to the bifurcation region. Mild atherosclerotic disease at the carotid bifurcation and ICA bulb but no stenosis or irregularity. Cervical ICA widely patent. Left carotid system: Left common carotid  artery widely patent to the bifurcation region. More extensive atherosclerotic disease at the left carotid bifurcation and proximal ICA bulb. Minimal diameter of the proximal ICA measures 3.5 mm. Compared to a more distal cervical ICA diameter of 5 mm, this indicates a 30% stenosis. Cervical ICA widely patent beyond that. Vertebral arteries: Right vertebral artery is dominant. The origin is widely patent. The right vertebral artery is widely patent through the cervical region without stenosis or irregularity. Left vertebral artery is non dominant. The vertebral artery origin is widely patent in the vessel is widely patent through the cervical region to the foramen magnum. Skeleton: Ordinary spondylosis and facet osteoarthritis. Other neck: No mass or lymphadenopathy. Upper chest: Negative. Review of the MIP images confirms the above findings CTA HEAD FINDINGS Anterior circulation: Both internal carotid arteries are patent through the skullbase and siphon regions. There is peripheral calcification in the carotid siphon regions but no stenosis more than 30-40%. Anterior and middle cerebral vessels are patent without proximal stenosis, aneurysm or vascular malformation. Patent posterior communicating arteries bilaterally. Posterior circulation: Both vertebral arteries are patent at the foramen magnum. Both posterior inferior cerebellar arteries are patent. Both vertebral arteries give supply to the basilar. There is no basilar stenosis. Superior cerebellar arteries are patent bilaterally. Right PCA is patent. Left PCA is occluded 9 mm beyond its origin. Venous sinuses: Patent and normal. Anatomic variants: None significant Delayed phase: No abnormal enhancement Review of the MIP images confirms the above findings IMPRESSION: Aortic atherosclerosis without pronounced irregularity, aneurysm or dissection. Atherosclerotic disease at both carotid bifurcations. No stenosis on the right. 30-40% stenosis of the proximal ICA  on the left. Vertebral artery origins are widely patent and both vertebral arteries are widely patent to the basilar. There is occlusion of the left PCA 9 mm beyond its origin. The posterior cerebral arteries receive contribution from the basilar tip but also from bilateral small posterior communicating arteries which are patent. Therefore, occlusion of the left PCA could be due to embolic disease from the anterior or posterior circulation. Electronically Signed   By: Nelson Chimes M.D.   On: 05/16/2017 20:16   Mr Brain Wo Contrast  Result Date: 05/16/2017 CLINICAL DATA:  Worsening of confusion and memory loss over the last few weeks. Previous acute infarction 02/17/2017. EXAM: MRI HEAD WITHOUT CONTRAST TECHNIQUE: Multiplanar, multiecho pulse sequences of the brain and surrounding structures were obtained without intravenous contrast. COMPARISON:  CT 04/28/2017.  MRI 02/17/2017. FINDINGS: Brain: Diffusion imaging shows multiple areas of acute infarction within the left posterior cerebral artery territory affecting the posteromedial temporal lobe, occipital lobe, left thalamus and left splenium corpus callosum. These are small patchy infarctions without a large confluent geographic infarction. No evidence of mass effect or hemorrhage. No other acute infarction. No brainstem or cerebellar insult. Old infarction left lateral thalamus. Chronic small-vessel ischemic changes of the white matter. Incidental cyst of the velum interpositum. No hydrocephalus. No extra-axial collection. Vascular: Major vessels at the base of the brain show flow. Skull and upper cervical spine: Negative Sinuses/Orbits: Clear/normal Other: None significant IMPRESSION: Since the previous study, there are newly  seen scattered areas of acute infarction within the left PCA territory affecting the posteromedial temporal lobe, thalamus and left splenium of the corpus callosum. No large confluent geographic region of infarction. No sign of swelling  or hemorrhage. Major vessels at the base of the brain show flow presently. These results will be called to the ordering clinician or representative by the Radiologist Assistant, and communication documented in the PACS or zVision Dashboard. Electronically Signed   By: Nelson Chimes M.D.   On: 05/16/2017 13:28       ASSESSMENT AND PLAN   Left PCA acute to subacute infarct  MRI head done as outpatient shows thalamic, temporal and occipital lobe infarcts in the left PCA territory. CTA head and neck: Shows P2 occlusion, no thrombus in basilar artery. She had diffuse atherosclerotic disease with mild to moderate intracranial stenosis.    Stroke Risk Factors -  HTN, DM Mechanism: ICAD vs cardioembolic   Plan Continue Dual antiplatelet ASA and Plavix Permissive HTN even though outside of window.  Patient needs repeat Echo ( previous Echo normal with atrial enlargement)  Telemetry monitoring,Consider linq device as outpatient to monitor for paroxysmal Afib Frequent neuro checks NPO until passes stroke swallow screen  Please page stroke NP  Or  PA  Or MD from 8am -4 pm  as this patient from this time will be  followed by the stroke.   You can look them up on [email or web address]  Password [email or web address]

## 2017-05-16 NOTE — ED Provider Notes (Signed)
[facility] DEPT Provider Note   CSN: [id number] Arrival date & time: 05/16/17  1721     History   Chief Complaint Chief Complaint  Patient presents with  . Cerebrovascular Accident    HPI Kimberly Duarte is a 76 y.o. female.  HPI The patient has a history of prior stroke and initially presented to [facility] 2 weeks ago for lightheadedness. At time her symptoms were felt to be related to dehydration. Over the last couple weeks patient has had increasing weakness and difficulty walking. The weakness is on the right side. She saw Dr. Brigitte Pulse from neurology and had an outpatient EEG. The EEG test was unremarkable. Patient had an outpatient MRI today that shows multiple acute left sided strokes. Patient was instructed to come to the emergency room for further treatment and evaluation. Patient denies any difficulties with her speech. No vomiting diarrhea. No fever. Past Medical History:  Diagnosis Date  . Depression   . Diabetes mellitus without complication ([place])   . Diabetic peripheral neuropathy ([place])   . GERD (gastroesophageal reflux disease)   . Hyperlipidemia   . Hypertension   . Obesity   . Stroke ([place])   . Uterine cancer [facility])     Patient Active Problem List   Diagnosis Date Noted  . Acute left PCA stroke ([place]) 05/16/2017  . CVA (cerebral vascular accident) ([place]) 11/27/2016    Past Surgical History:  Procedure Laterality Date  . ABDOMINAL HYSTERECTOMY    . COLONOSCOPY    . COLONOSCOPY WITH PROPOFOL N/A 09/04/2015   Procedure: COLONOSCOPY WITH PROPOFOL;  Surgeon: Manya Silvas, MD;  Location[facility] ENDOSCOPY;  Service: Endoscopy;  Laterality: N/A;  . ESOPHAGOGASTRODUODENOSCOPY (EGD) WITH PROPOFOL N/A 09/04/2015   Procedure: ESOPHAGOGASTRODUODENOSCOPY (EGD) WITH PROPOFOL;  Surgeon: Manya Silvas, MD;  Location: [facility] ENDOSCOPY;  Service: Endoscopy;  Laterality: N/A;  . UPPER GI ENDOSCOPY      OB History    No data available        Home Medications    Prior to Admission medications   Medication Sig Start Date End Date Taking? Authorizing Provider  aspirin 325 MG tablet Take 325 mg by mouth daily.   Yes [provider]  atorvastatin (LIPITOR) 40 MG tablet Take 1 tablet (40 mg total) by mouth daily at 6 PM. 11/27/16  Yes Sudini, Srikar, MD  citalopram (CELEXA) 20 MG tablet Take 20 mg by mouth daily.   Yes [provider]  clopidogrel (PLAVIX) 75 MG tablet Take 1 tablet (75 mg total) by mouth daily. 02/16/17  Yes Mody, Sital, MD  glipiZIDE (GLUCOTROL) 10 MG tablet Take 10 mg by mouth 2 (two) times daily.    Yes [provider]  hydrochlorothiazide (MICROZIDE) 12.5 MG capsule Take 12.5 mg by mouth daily.   Yes [provider]  insulin lispro (HUMALOG) 100 UNIT/ML KiwkPen Inject 14 Units into the skin 3 (three) times daily.  04/07/17  Yes [provider]  insulin NPH Human (HUMULIN N,NOVOLIN N) 100 UNIT/ML injection Inject 15-30 Units into the skin 3 (three) times daily. tk 30 units qam and 30 units qhs, and if sugar is high during the afternoon, tk an additional 15 units   Yes [provider]  lisinopril (PRINIVIL,ZESTRIL) 20 MG tablet Take 20 mg by mouth daily.   Yes [provider]  meloxicam (MOBIC) 15 MG tablet Take 15 mg by mouth daily.   Yes [provider]  metFORMIN (GLUCOPHAGE) 1000 MG tablet Take 1,000 mg  by mouth 2 (two) times daily with a meal.   Yes [provider]  omeprazole (PRILOSEC) 20 MG capsule Take 20 mg by mouth daily as needed.    Yes [provider]  pregabalin (LYRICA) 225 MG capsule Take 225 mg by mouth 2 (two) times daily.   Yes [provider]  rOPINIRole (REQUIP) 0.5 MG tablet Take 0.5 mg by mouth every evening.   Yes [provider]  aspirin 81 MG chewable tablet Chew 1 tablet (81 mg total) by mouth daily. Patient not taking: Reported on 05/16/2017 02/16/17   Bettey Costa, MD  Vitamin D,  Ergocalciferol, (DRISDOL) 50000 UNITS CAPS capsule Take 50,000 Units by mouth every 7 (seven) days.    [provider]    Family History Family History  Problem Relation Age of Onset  . Breast cancer Sister 11  . Colon cancer Mother   . Heart disease Father     Social History Social History  Substance Use Topics  . Smoking status: Never Smoker  . Smokeless tobacco: Never Used  . Alcohol use No     Allergies   Actos [pioglitazone]; Byetta 10 mcg pen [exenatide]; and Victoza [liraglutide]   Review of Systems Review of Systems  All other systems reviewed and are negative.    Physical Exam Updated Vital Signs BP (!) 166/83   Pulse 76   Temp 98.4 F (36.9 C) (Oral)   Resp 16   Ht 1.6 m (5\' 3" )   Wt 88.5 kg (195 lb)   SpO2 97%   BMI 34.54 kg/m   Physical Exam  Constitutional: She appears well-developed and well-nourished. No distress.  HENT:  Head: Normocephalic and atraumatic.  Right Ear: External ear normal.  Left Ear: External ear normal.  Eyes: Conjunctivae are normal. Right eye exhibits no discharge. Left eye exhibits no discharge. No scleral icterus.  Neck: Neck supple. No tracheal deviation present.  Cardiovascular: Normal rate, regular rhythm and intact distal pulses.   Pulmonary/Chest: Effort normal and breath sounds normal. No stridor. No respiratory distress. She has no wheezes. She has no rales.  Abdominal: Soft. Bowel sounds are normal. She exhibits no distension. There is no tenderness. There is no rebound and no guarding.  Musculoskeletal: She exhibits no edema or tenderness.  Neurological: She is alert. No cranial nerve deficit (no facial droop, extraocular movements intact, no slurred speech) or sensory deficit. She exhibits normal muscle tone. She displays no seizure activity. Coordination normal.  4-5 strength right lower extremity, some difficulty lifting her leg off the bed compared to the left.  4-5 strength right upper extremity.    Skin: Skin is warm and dry. No rash noted.  Psychiatric: She has a normal mood and affect.  Nursing note and vitals reviewed.    ED Treatments / Results  Labs (all labs ordered are listed, but only abnormal results are displayed) Labs Reviewed  APTT - Abnormal; Notable for the following:       Result Value   aPTT 22 (*)    All other components within normal limits  COMPREHENSIVE METABOLIC PANEL - Abnormal; Notable for the following:    Glucose, Bld 211 (*)    Creatinine, Ser 1.06 (*)    GFR calc non Af Amer 50 (*)    GFR calc Af Amer 58 (*)    All other components within normal limits  I-STAT CHEM 8, ED - Abnormal; Notable for the following:    BUN 25 (*)    Glucose, Bld  209 (*)    All other components within normal limits  PROTIME-INR  CBC  DIFFERENTIAL  I-STAT TROPONIN, ED    EKG  EKG Interpretation None       Radiology Mr Brain Wo Contrast  Result Date: 05/16/2017 CLINICAL DATA:  Worsening of confusion and memory loss over the last few weeks. Previous acute infarction 02/17/2017. EXAM: MRI HEAD WITHOUT CONTRAST TECHNIQUE: Multiplanar, multiecho pulse sequences of the brain and surrounding structures were obtained without intravenous contrast. COMPARISON:  CT 04/28/2017.  MRI 02/17/2017. FINDINGS: Brain: Diffusion imaging shows multiple areas of acute infarction within the left posterior cerebral artery territory affecting the posteromedial temporal lobe, occipital lobe, left thalamus and left splenium corpus callosum. These are small patchy infarctions without a large confluent geographic infarction. No evidence of mass effect or hemorrhage. No other acute infarction. No brainstem or cerebellar insult. Old infarction left lateral thalamus. Chronic small-vessel ischemic changes of the white matter. Incidental cyst of the velum interpositum. No hydrocephalus. No extra-axial collection. Vascular: Major vessels at the base of the brain show flow. Skull and upper cervical  spine: Negative Sinuses/Orbits: Clear/normal Other: None significant IMPRESSION: Since the previous study, there are newly seen scattered areas of acute infarction within the left PCA territory affecting the posteromedial temporal lobe, thalamus and left splenium of the corpus callosum. No large confluent geographic region of infarction. No sign of swelling or hemorrhage. Major vessels at the base of the brain show flow presently. These results will be called to the ordering clinician or representative by the Radiologist Assistant, and communication documented in the PACS or zVision Dashboard. Electronically Signed   By: Nelson Chimes M.D.   On: 05/16/2017 13:28    Procedures Procedures (including critical care time)  Medications Ordered in ED Medications  iopamidol (ISOVUE-370) 76 % injection (not administered)     Initial Impression / Assessment and Plan / ED Course  I have reviewed the triage vital signs and the nursing notes.  Pertinent labs & imaging results that were available during my care of the patient were reviewed by me and considered in my medical decision making (see chart for details).  Clinical Course as of May 16 1942  Fri May 16, 2017  1832 Patient's outpatient MRI shows an acute stroke. Dr. Darl Householder  spoke with the neuro hospitalist., Dr Aroor CT angios head and neck were recommended  [JK]    Clinical Course User Index [JK] Dorie Rank, MD    Pt presents to the ED with an outpatient MRI confirming a stroke.  Discussed with medical service.  Will plan on admission for for further workup. Final Clinical Impressions(s) / ED Diagnoses   Final diagnoses:  Cerebral infarction, unspecified mechanism [facility])    New Prescriptions New Prescriptions   No medications on file     Dorie Rank, MD 05/16/17 1944

## 2017-05-16 NOTE — ED Provider Notes (Signed)
MSE was initiated and I personally evaluated the patient and placed orders (if any) at  5:52 PM on May 16, 2017.  The patient appears stable so that the remainder of the MSE may be completed by another provider.  Kimberly Duarte is a 76 y.o. female hx of HTN, previous stroke here with worsening R sided weakness for the last 2 weeks. Went to [facility] 2 weeks ago and was thought to have mild dehydration and was sent home. She had death in the family and had worsening R sided weakness and trouble walking since then. Saw Dr. Manuella Ghazi from neurology at [facility] and had EEG a week ago that was unremarkable. Had MRI today that showed multiple acute L temporal strokes. States that symptoms are progressive over the last 2 weeks and not significant worsening over the last 24 hrs. Dr. Manuella Ghazi called me regarding the resulted. I examined patient in triage. She has no obvious facial droop. Strength 4/5 R arm and leg, 5/5 L side, nl finger to nose bilaterally. Given that onset of symptoms > 24 hrs, will not activate code stroke. Called Dr. Lorraine Lax from neurology, who recommend CTA head/neck. He will see patient as consult and recommend admission.     Drenda Freeze, MD 05/16/17 [phone]
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
## 2017-05-17 DIAGNOSIS — I639 Cerebral infarction, unspecified: Secondary | ICD-10-CM | POA: Diagnosis not present

## 2017-05-17 DIAGNOSIS — I63532 Cerebral infarction due to unspecified occlusion or stenosis of left posterior cerebral artery: Secondary | ICD-10-CM | POA: Diagnosis not present

## 2017-05-17 DIAGNOSIS — E1159 Type 2 diabetes mellitus with other circulatory complications: Secondary | ICD-10-CM

## 2017-05-17 DIAGNOSIS — Z794 Long term (current) use of insulin: Secondary | ICD-10-CM

## 2017-05-17 DIAGNOSIS — I10 Essential (primary) hypertension: Secondary | ICD-10-CM | POA: Diagnosis not present

## 2017-05-17 LAB — LIPID PANEL
CHOLESTEROL: 133 mg/dL (ref 0–200)
HDL: 37 mg/dL — ABNORMAL LOW (ref >40)
LDL Cholesterol: 78 mg/dL (ref 0–99)
Total CHOL/HDL Ratio: 3.6 RATIO
Triglycerides: 91 mg/dL (ref <150)
VLDL: 18 mg/dL (ref 0–40)

## 2017-05-17 LAB — GLUCOSE, CAPILLARY
GLUCOSE-CAPILLARY: 109 mg/dL — AB (ref 65–99)
Glucose-Capillary: 245 mg/dL — ABNORMAL HIGH (ref 65–99)
Glucose-Capillary: 198 mg/dL — ABNORMAL HIGH (ref 65–99)
Glucose-Capillary: 218 mg/dL — ABNORMAL HIGH (ref 65–99)

## 2017-05-17 LAB — HEMOGLOBIN A1C
HEMOGLOBIN A1C: 8.1 % — AB (ref 4.8–5.6)
MEAN PLASMA GLUCOSE: 185.77 mg/dL

## 2017-05-17 MED ORDER — ASPIRIN EC 81 MG PO TBEC: 81.0000 mg | DELAYED_RELEASE_TABLET | Freq: Every day | ORAL | Status: DC

## 2017-05-17 MED ORDER — ASPIRIN EC 325 MG PO TBEC
325.0000 mg | DELAYED_RELEASE_TABLET | Freq: Every day | ORAL | Status: DC
Start: 2017-05-17 — End: 2017-05-18
  Administered 2017-05-17 – 2017-05-18 (×2): 325 mg via ORAL
  Filled 2017-05-17 (×2): qty 1

## 2017-05-17 NOTE — Progress Notes (Signed)
STROKE TEAM PROGRESS NOTE   HISTORY OF PRESENT ILLNESS (per record) Kimberly Duarte is an 76 y.o. female  With HTN, DM and stroke in June 2018 ( left thalamic) who presented with confusion and imbalance about 2 weeks ago ( see nurses note on 7/30). She was seen by her outpatient neurologist who ordered an MRI head and EEG. EEG was apparently negative however MRI shows multiple acute infarcts.  Date last known well: about 2 weeks ago Time last known well: about 2 weeks ago  tPA Given:No   Past medical history - depression, diabetes mellitus, peripheral neuropathy, gastroesophageal reflux disease, hyperlipidemia, hypertension, obesity, previous stroke, and uterine cancer    SUBJECTIVE (INTERVAL HISTORY) No family is at the bedside.  She has no complains. No neuro deficit seen on exam. She follows with Dr. Manuella Ghazi at [facility] and admitted for left PCA infarcts. No hemianopia.    OBJECTIVE Temp:  [98.1 F (36.7 C)-98.7 F (37.1 C)] 98.6 F (37 C) (08/18 0645) Pulse Rate:  [62-79] 69 (08/18 0645) Cardiac Rhythm: Normal sinus rhythm;Other (Comment) (08/17 2050) Resp:  [14-20] 18 (08/18 0645) BP: (120-187)/(64-108) 120/98 (08/18 0645) SpO2:  [95 %-98 %] 96 % (08/18 0645) Weight:  [180 lb 12.4 oz (82 kg)-195 lb (88.5 kg)] 180 lb 12.4 oz (82 kg) (08/17 2044)  CBC:   Recent Labs Lab 05/16/17 1750 05/16/17 1838  WBC 8.3  --   NEUTROABS 4.6  --   HGB 13.8 13.9  HCT 40.0 41.0  MCV 85.5  --   PLT 216  --     Basic Metabolic Panel:   Recent Labs Lab 05/16/17 1750 05/16/17 1838  NA 138 140  K 4.0 3.9  CL 103 101  CO2 25  --   GLUCOSE 211* 209*  BUN 19 25*  CREATININE 1.06* 1.00  CALCIUM 9.5  --     Lipid Panel:     Component Value Date/Time   CHOL 133 05/17/2017 0459   TRIG 91 05/17/2017 0459   HDL 37 (L) 05/17/2017 0459   CHOLHDL 3.6 05/17/2017 0459   VLDL 18 05/17/2017 0459   LDLCALC 78 05/17/2017 0459   HgbA1c:  Lab Results  Component Value Date   HGBA1C 8.1 (H) 05/17/2017   Urine Drug Screen: No results found for: LABOPIA, COCAINSCRNUR, LABBENZ, AMPHETMU, THCU, LABBARB  Alcohol Level No results found for: [place] I have personally reviewed the radiological images below and agree with the radiology interpretations.  Ct Angio Head W Or Wo Contrast Ct Angio Neck W And/or Wo Contrast 05/16/2017 Aortic atherosclerosis without pronounced irregularity, aneurysm or dissection. Atherosclerotic disease at both carotid bifurcations. No stenosis on the right. 30-40% stenosis of the proximal ICA on the left. Vertebral artery origins are widely patent and both vertebral arteries are widely patent to the basilar. There is occlusion of the left PCA 9 mm beyond its origin. The posterior cerebral arteries receive contribution from the basilar tip but also from bilateral small posterior communicating arteries which are patent. Therefore, occlusion of the left PCA could be due to embolic disease from the anterior or posterior circulation.   Mr Brain Wo Contrast 05/16/2017 Since the previous study, there are newly seen scattered areas of acute infarction within the left PCA territory affecting the posteromedial temporal lobe, thalamus and left splenium of the corpus callosum. No large confluent geographic region of infarction. No sign of swelling or hemorrhage. Major vessels at the base of the brain show flow presently.   TTE  pending   PHYSICAL EXAM Vitals:   05/17/17 0245 05/17/17 0445 05/17/17 0645 05/17/17 0930  BP: 140/74 (!) 138/100 (!) 120/98 116/62  Pulse: 79 68 69 66  Resp: 18 19 18 20   Temp: 98.6 F (37 C) 98.7 F (37.1 C) 98.6 F (37 C) 98.4 F (36.9 C)  TempSrc: Oral Oral Oral Oral  SpO2: 98% 97% 96% 96%  Weight:      Height:        Temp:  [98.1 F (36.7 C)-98.7 F (37.1 C)] 98.4 F (36.9 C) (08/18 0930) Pulse Rate:  [62-79] 66 (08/18 0930) Resp:  [14-20] 20 (08/18 0930) BP: (116-187)/(62-108) 116/62 (08/18 0930) SpO2:   [95 %-98 %] 96 % (08/18 0930) Weight:  [180 lb 12.4 oz (82 kg)-195 lb (88.5 kg)] 180 lb 12.4 oz (82 kg) (08/17 2044)  General - Well nourished, well developed, in no apparent distress.  Ophthalmologic - Sharp disc margins OU.  Cardiovascular - Regular rate and rhythm.  Mental Status -  Level of arousal and orientation to time, place, and person were intact. Language including expression, naming, repetition, comprehension was assessed and found intact. Attention span and concentration were normal. Fund of Knowledge was assessed and was intact.  Cranial Nerves II - XII - II - Visual field intact OU. III, IV, VI - Extraocular movements intact. V - Facial sensation intact bilaterally. VII - Facial movement intact bilaterally. VIII - Hearing & vestibular intact bilaterally. X - Palate elevates symmetrically. XI - Chin turning & shoulder shrug intact bilaterally. XII - Tongue protrusion intact.  Motor Strength - The patient's strength was normal in all extremities and pronator drift was absent.  Bulk was normal and fasciculations were absent.   Motor Tone - Muscle tone was assessed at the neck and appendages and was normal.  Reflexes - The patient's reflexes were 1+ in all extremities and she had no pathological reflexes.  Sensory - Light touch, temperature/pinprick, vibration and proprioception, and Romberg testing were assessed and were symmetrical.    Coordination - The patient had normal movements in the hands and feet with no ataxia or dysmetria.  Tremor was absent.  Gait and Station - deferred.   ASSESSMENT/PLAN Ms. Kimberly Duarte is a 76 y.o. female with history of a previous stroke, diabetes mellitus, peripheral neuropathy, depression, hypertension, obesity, and uterine cancer presenting with confusion and imbalance with an abnormal MRI consistent with left PCA infarcts. She did not receive IV t-PA due to late presentation.  Strokes: left PCA acute infarcts, likely due to  left PCA occlusion (large vessel disease) secondary to risk factors including HTN, DM, HLD. She had left thalamic infarct in 01/2017, consistent with left PCA infarct, therefore, embolic event is less likely.   Resultant no significant neuro deficit  MRI head left PCA territory punctate multifocal infarcts  CTA head and neck - left PCA occlusion, stable left ICA 30-40% stenosis  2D Echo  pending  Due to the multifocal pattern and left patent PCOM, will recommend 30 day cardiac event monitoring to rule out afib. This can be arranged by her cardiologist Dr. Saralyn Pilar at [facility].  LDL 78  HgbA1c 8.1  lovenox for VTE prophylaxis Diet heart healthy/carb modified Room service appropriate? Yes; Fluid consistency: Thin  aspirin 81 mg daily and clopidogrel 75 mg daily prior to admission, now on aspirin 325 mg daily and clopidogrel 75 mg daily. Continue DAPT until she sees Dr. Manuella Ghazi.   Patient counseled to be compliant with her antithrombotic medications  Ongoing aggressive stroke risk factor management  Therapy recommendations:  pending  Disposition:  Pending  Hx of stroke/TIA  10/2016 - left facial numbness - MRI negative but ? Old right frontal infarct - A1C 8.4, LDL UTC due to TG 474 - on ASA 325mg   01/2017 - MRI left thalamic infarcts - MRA head and neck - b/l PCA stenosis - CTA head and neck - left ICA 30-40% stenosis. B/l PCA tandem stenosis - b/l patent PCOM - LDL 73 and A1C 9.4 - put on ASA and plavix  Hypertension  Stable  No need more permissive hypertension  On lisinopril  Long-term BP goal normotensive  Hyperlipidemia  Home meds:  lipitor 40, resumed in hospital  LDL 78, goal < 70  Increase lipitor to 80mg  for maximized therapy  Continue statin at discharge  Diabetes  HgbA1c 8.1, goal < 7.0  Uncontrolled  SSI  On NPH  Follow up with PCP and endocrinologist  Other Stroke Risk Factors  Advanced age  Obesity, Body mass index is 32.02 kg/m.,  recommend weight loss, diet and exercise as appropriate   Other Active Problems  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 Manuella Ghazi at [place] [facility].  Hospital day # 0  Neurology will sign off. Please call with questions. Pt will follow up with Dr. Manuella Ghazi at [facility] in [place] in about 3-4 weeks. Thanks for the consult.  Rosalin Hawking, MD PhD Stroke Neurology 05/17/2017 1:49 PM   To contact Stroke Continuity provider, please refer to [email or web address] After hours, contact General Neurology

## 2017-05-17 NOTE — Evaluation (Signed)
Physical Therapy Evaluation Patient Details Name: Kimberly Duarte MRN: [id number] DOB: April 10, 1941 Today's Date: 05/17/2017   History of Present Illness  Patient is a 76 y/o female who presents from neurologist office due to confusion and imbalance. Head MRI- thalamic, temporal and occipital lobe infarcts in the left PCA territory.  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 PMH includes DM, HTN, recent CVA June 2018 (left thalamic)., uterine ca, HLD.   Clinical Impression  Patient presents with mild right sided weakness, impaired memory and decreased balance s/p above. Tolerated gait training with min guard assist for safety due to decreased foot clearance RLE. Score 18/24 on DGI indicating increased risk for falls. Pt lives alone and Mod I PTA and recently d/ced from rehab from prior stroke in June. Family works during the day. Education re: signs/symptoms of stroke and fall reduction. Will follow acutely to maximize independence and mobility prior to return home.     Follow Up Recommendations Home health PT;Supervision for mobility/OOB    Equipment Recommendations  None recommended by PT    Recommendations for Other Services       Precautions / Restrictions Precautions Precautions: Fall Restrictions Weight Bearing Restrictions: No      Mobility  Bed Mobility               General bed mobility comments: Sitting EOB upon PT arrival.   Transfers Overall transfer level: Needs assistance Equipment used: None Transfers: Sit to/from Stand Sit to Stand: Supervision         General transfer comment: Supervision for safety. Stood from [employer], from low toilet x1. Trandferrd to chair post ambulation.  Ambulation/Gait Ambulation/Gait assistance: Min guard Ambulation Distance (Feet): 200 Feet Assistive device: None Gait Pattern/deviations: Step-through pattern;Decreased stride length;Decreased dorsiflexion - right Gait velocity:  decreased Gait velocity interpretation: <1.8 ft/sec, indicative of risk for recurrent falls General Gait Details: Pt with slow, mildly unsteady gait with decreased foot clearance RLE. decreased arm swing as well. Cues to pick up right foot. See balance section for details.  Stairs Stairs: Yes Stairs assistance: Min guard Stair Management: Step to pattern;Two rails Number of Stairs: 3 (+ 2 steps) General stair comments: Cues for technique and safety.   Wheelchair Mobility    Modified Rankin (Stroke Patients Only) Modified Rankin (Stroke Patients Only) Pre-Morbid Rankin Score: Slight disability Modified Rankin: Moderate disability     Balance Overall balance assessment: Needs assistance Sitting-balance support: Feet supported;No upper extremity supported Sitting balance-Leahy Scale: Good     Standing balance support: During functional activity Standing balance-Leahy Scale: Fair Standing balance comment: Able to stand at sinka nd wash hands without difficulty.                  Standardized Balance Assessment Standardized Balance Assessment : Dynamic Gait Index   Dynamic Gait Index Level Surface: Mild Impairment Change in Gait Speed: Mild Impairment Gait with Horizontal Head Turns: Mild Impairment Gait with Vertical Head Turns: Mild Impairment Gait and Pivot Turn: Normal Step Over Obstacle: Mild Impairment Step Around Obstacles: Normal Steps: Mild Impairment Total Score: 18       Pertinent Vitals/Pain Pain Assessment: No/denies pain    Home Living Family/patient expects to be discharged to:: Private residence Living Arrangements: Alone Available Help at Discharge: Family;Available PRN/intermittently (family lives next door but works.) Type of Home: Other(Comment) (modular home) Home Access: Stairs to enter Entrance Stairs-Rails: None Entrance Stairs-Number of Steps: 1 step with B columns to hold  onto Home Layout: One level Home Equipment: Walker -  standard      Prior Function Level of Independence: Independent         Comments: Prior to stroke from 5/19 admission, pt was independent with ADL, IADL (including driving), med management, and no falls in past 12 months     Hand Dominance   Dominant Hand: Right    Extremity/Trunk Assessment   Upper Extremity Assessment Upper Extremity Assessment: Defer to OT evaluation;RUE deficits/detail RUE Deficits / Details: Residual weakness from prior CVA in June but functional.    Lower Extremity Assessment Lower Extremity Assessment: RLE deficits/detail RLE Deficits / Details: Grossly ~4/5 throughout but pt reports noted weakness during functional tasks. RLE Sensation:  (wFL)       Communication   Communication: No difficulties  Cognition Arousal/Alertness: Awake/alert Behavior During Therapy: WFL for tasks assessed/performed Overall Cognitive Status: Impaired/Different from baseline Area of Impairment: Memory                     Memory: Decreased short-term memory         General Comments: Reports difficulty with memory lately and some word finding issues as well.       General Comments      Exercises     Assessment/Plan    PT Assessment Patient needs continued PT services  PT Problem List Decreased strength;Decreased mobility;Decreased cognition;Decreased balance       PT Treatment Interventions Therapeutic activities;Gait training;Therapeutic exercise;Patient/family education;Balance training;Stair training;Functional mobility training;Neuromuscular re-education;DME instruction    PT Goals (Current goals can be found in the Care Plan section)  Acute Rehab PT Goals Patient Stated Goal: to go home PT Goal Formulation: With patient Time For Goal Achievement: 05/31/17 Potential to Achieve Goals: Good    Frequency Min 4X/week   Barriers to discharge Decreased caregiver support lives alone    Co-evaluation               AM-PAC PT "6  Clicks" Daily Activity  Outcome Measure Difficulty turning over in bed (including adjusting bedclothes, sheets and blankets)?: None Difficulty moving from lying on back to sitting on the side of the bed? : None Difficulty sitting down on and standing up from a chair with arms (e.g., wheelchair, bedside commode, etc,.)?: None Help needed moving to and from a bed to chair (including a wheelchair)?: None Help needed walking in hospital room?: A Little Help needed climbing 3-5 steps with a railing? : A Little 6 Click Score: 22    End of Session Equipment Utilized During Treatment: Gait belt Activity Tolerance: Patient tolerated treatment well Patient left: in chair;with call bell/phone within reach;with chair alarm set Nurse Communication: Mobility status PT Visit Diagnosis: Unsteadiness on feet (R26.81);Hemiplegia and hemiparesis;Other abnormalities of gait and mobility (R26.89) Hemiplegia - Right/Left: Right Hemiplegia - dominant/non-dominant: Dominant Hemiplegia - caused by: Cerebral infarction    Time: 8676-1950 PT Time Calculation (min) (ACUTE ONLY): 31 min   Charges:   PT Evaluation $PT Eval Moderate Complexity: 1 Mod     PT G Codes:   PT G-Codes **NOT FOR INPATIENT CLASS** Functional Assessment Tool Used: Clinical judgement Functional Limitation: Mobility: Walking and moving around Mobility: Walking and Moving Around Current Status (D3267): At least 1 percent but less than 20 percent impaired, limited or restricted Mobility: Walking and Moving Around Goal Status [phone]): At least 1 percent but less than 20 percent impaired, limited or restricted    [place], [place], DPT [phone]  Kimberly Duarte 05/17/2017, 8:31 AM

## 2017-05-17 NOTE — Evaluation (Signed)
Occupational Therapy Evaluation and Discharge  Patient Details Name: Kimberly Duarte MRN: [id number] DOB: 12/10/1940 Today's Date: 05/17/2017    History of Present Illness Patient is a 75 y/o female who presents from neurologist office due to confusion and imbalance. Head MRI- thalamic, temporal and occipital lobe infarcts in the left PCA territory.  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 PMH includes DM, HTN, recent CVA June 2018 (left thalamic)., uterine ca, HLD.    Clinical Impression   Pt reports she was independent with ADL PTA. Currently pt is overall supervision for ADL and functional mobility with mild balance deficits. Educated pt on home safety and fall prevention strategies. Pt planning to d/c home alone with intermittent supervision from family. No further acute OT needs identified; signing off at this time. Please re-consult if needs change. Thank you for this referral.    Follow Up Recommendations  No OT follow up;Supervision - Intermittent    Equipment Recommendations  None recommended by OT    Recommendations for Other Services       Precautions / Restrictions Precautions Precautions: Fall Restrictions Weight Bearing Restrictions: No      Mobility Bed Mobility Overal bed mobility: Modified Independent                Transfers Overall transfer level: Needs assistance Equipment used: None Transfers: Sit to/from Stand Sit to Stand: Supervision         General transfer comment: for safety    Balance Overall balance assessment: Needs assistance Sitting-balance support: Feet supported;No upper extremity supported Sitting balance-Leahy Scale: Normal     Standing balance support: No upper extremity supported;During functional activity Standing balance-Leahy Scale: Good                             ADL either performed or assessed with clinical judgement   ADL Overall ADL's : Needs  assistance/impaired Eating/Feeding: Independent;Sitting   Grooming: Supervision/safety;Standing   Upper Body Bathing: Set up;Sitting   Lower Body Bathing: Supervison/ safety;Sit to/from stand   Upper Body Dressing : Set up;Sitting   Lower Body Dressing: Supervision/safety;Sit to/from stand   Toilet Transfer: Supervision/safety;Ambulation;Regular Toilet       Tub/ Banker: Supervision/safety;Walk-in shower;Ambulation;Grab bars   Functional mobility during ADLs: Supervision/safety General ADL Comments: Educated pt on home safety and fall prevention strategies.     Vision Baseline Vision/History: Wears glasses;Cataracts Wears Glasses: At all times Patient Visual Report: No change from baseline Vision Assessment?: No apparent visual deficits     Perception     Praxis      Pertinent Vitals/Pain Pain Assessment: No/denies pain     Hand Dominance Right   Extremity/Trunk Assessment Upper Extremity Assessment Upper Extremity Assessment: Overall WFL for tasks assessed (mild deficits from prior CVA but overall functional)   Lower Extremity Assessment Lower Extremity Assessment: Defer to PT evaluation   Cervical / Trunk Assessment Cervical / Trunk Assessment: Normal   Communication Communication Communication: No difficulties   Cognition Arousal/Alertness: Awake/alert Behavior During Therapy: WFL for tasks assessed/performed Overall Cognitive Status: Within Functional Limits for tasks assessed                                     General Comments       Exercises     Shoulder Instructions      Home  Living Family/patient expects to be discharged to:: Private residence Living Arrangements: Alone Available Help at Discharge: Family;Available PRN/intermittently Type of Home: Other(Comment) (modular home) Home Access: Stairs to enter Entrance Stairs-Number of Steps: 1 step with B columns to hold onto Entrance Stairs-Rails: None Home Layout:  One level     Bathroom Shower/Tub: Walk-in shower;Door   [employer] Toilet: Standard     Home Equipment: Walker - standard;Shower seat;Grab bars - tub/shower          Prior Functioning/Environment Level of Independence: Independent        Comments: Prior to stroke from 5/19 admission, pt was independent with ADL, IADL (including driving), med management, and no falls in past 12 months        OT Problem List:        OT Treatment/Interventions:      OT Goals(Current goals can be found in the care plan section) Acute Rehab OT Goals Patient Stated Goal: go home OT Goal Formulation: All assessment and education complete, DC therapy  OT Frequency:     Barriers to D/C:            Co-evaluation              AM-PAC PT "6 Clicks" Daily Activity     Outcome Measure Help from another person eating meals?: None Help from another person taking care of personal grooming?: A Little Help from another person toileting, which includes using toliet, bedpan, or urinal?: A Little Help from another person bathing (including washing, rinsing, drying)?: A Little Help from another person to put on and taking off regular upper body clothing?: None Help from another person to put on and taking off regular lower body clothing?: A Little 6 Click Score: 20   End of Session Nurse Communication: Mobility status  Activity Tolerance: Patient tolerated treatment well Patient left: in bed;with call bell/phone within reach  OT Visit Diagnosis: Unsteadiness on feet (R26.81);Pain Pain - Right/Left: Right Pain - part of body: Knee                Time: 1510-1526 OT Time Calculation (min): 16 min Charges:  OT General Charges $OT Visit: 1 Procedure OT Evaluation $OT Eval Moderate Complexity: 1 Procedure G-Codes: OT G-codes **NOT FOR INPATIENT CLASS** Functional Assessment Tool Used: Clinical judgement Functional Limitation: Self care Self Care Current Status (Q3009): At least 1 percent but  less than 20 percent impaired, limited or restricted Self Care Goal Status (Q3300): At least 1 percent but less than 20 percent impaired, limited or restricted Self Care Discharge Status [phone]): At least 1 percent but less than 20 percent impaired, limited or restricted   Mel Almond A. Ulice Brilliant, M.S., OTR/L Pager: [place] 05/17/2017, 3:33 PM

## 2017-05-17 NOTE — Progress Notes (Signed)
SLP Cancellation Note  Patient Details Name: Kimberly Duarte MRN: [id number] DOB: [birth year]   Cancelled treatment:       Reason Eval/Treat Not Completed: Patient at procedure or test/unavailable   Elvina Sidle, M.S., CCC-SLP 05/17/2017, 2:28 PM
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
## 2017-05-18 ENCOUNTER — Observation Stay (HOSPITAL_BASED_OUTPATIENT_CLINIC_OR_DEPARTMENT_OTHER): Payer: Medicare Other

## 2017-05-18 DIAGNOSIS — I639 Cerebral infarction, unspecified: Secondary | ICD-10-CM | POA: Diagnosis not present

## 2017-05-18 DIAGNOSIS — I63532 Cerebral infarction due to unspecified occlusion or stenosis of left posterior cerebral artery: Secondary | ICD-10-CM

## 2017-05-18 DIAGNOSIS — I10 Essential (primary) hypertension: Secondary | ICD-10-CM | POA: Diagnosis not present

## 2017-05-18 DIAGNOSIS — E1159 Type 2 diabetes mellitus with other circulatory complications: Secondary | ICD-10-CM | POA: Diagnosis not present

## 2017-05-18 LAB — GLUCOSE, CAPILLARY
GLUCOSE-CAPILLARY: 152 mg/dL — AB (ref 65–99)
GLUCOSE-CAPILLARY: 239 mg/dL — AB (ref 65–99)

## 2017-05-18 LAB — ECHOCARDIOGRAM COMPLETE
Height: 63 in
Weight: 2892.44 oz

## 2017-05-18 MED ORDER — ATORVASTATIN CALCIUM 80 MG PO TABS: 80.0000 mg | ORAL_TABLET | Freq: Every day | ORAL | 0 refills | Status: AC

## 2017-05-18 NOTE — Progress Notes (Signed)
  Echocardiogram 2D Echocardiogram has been performed.  Kimberly Duarte 05/18/2017, 9:39 AM
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
## 2017-05-18 NOTE — Evaluation (Signed)
Speech Language Pathology Evaluation Patient Details Name: Kimberly Duarte MRN: [id number] DOB: 05-22-1941 Today's Date: 05/18/2017 Time: 1500-1530 SLP Time Calculation (min) (ACUTE ONLY): 30 min  Problem List:  Patient Active Problem List   Diagnosis Date Noted  . Acute left PCA stroke ([place]) 05/16/2017  . DM (diabetes mellitus) ([place]) 05/16/2017  . HTN (hypertension) 05/16/2017  . CVA (cerebral vascular accident) ([place]) 11/27/2016   Past Medical History:  Past Medical History:  Diagnosis Date  . Depression   . Diabetes mellitus without complication ([place])   . Diabetic peripheral neuropathy ([place])   . GERD (gastroesophageal reflux disease)   . Hyperlipidemia   . Hypertension   . Obesity   . Stroke ([place])   . Uterine cancer [facility])    Past Surgical History:  Past Surgical History:  Procedure Laterality Date  . ABDOMINAL HYSTERECTOMY    . COLONOSCOPY    . COLONOSCOPY WITH PROPOFOL N/A 09/04/2015   Procedure: COLONOSCOPY WITH PROPOFOL;  Surgeon: Manya Silvas, MD;  Location: [facility] ENDOSCOPY;  Service: Endoscopy;  Laterality: N/A;  . ESOPHAGOGASTRODUODENOSCOPY (EGD) WITH PROPOFOL N/A 09/04/2015   Procedure: ESOPHAGOGASTRODUODENOSCOPY (EGD) WITH PROPOFOL;  Surgeon: Manya Silvas, MD;  Location: [facility] ENDOSCOPY;  Service: Endoscopy;  Laterality: N/A;  . UPPER GI ENDOSCOPY     HPI:  Patient is a 76 y/o female who presents from neurologist office due to confusion and imbalance. Head MRI- thalamic, temporal and occipital lobe infarcts in the left PCA territory.  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 PMH includes DM, HTN, recent CVA June 2018 (left thalamic)., uterine ca, HLD.    Assessment / Plan / Recommendation Clinical Impression  Patient presents with cognitive communication impairment with deficits in short term and working memory, problem solving and sequencing. Administered MOCA form 7.1; pt scored 18/30 indicating cognitive  impairment. Prior to recent CVAs, pt lived alone, was driving, cooking, managing medications and paying bills independently. Patient would benefit from skilled ST following d/c to address the above impairments to maximize cognitive function, independence and safety. Recommend HH SLP and intermittent supervision/assistance. No further acute needs identified; SLP will s/o.     SLP Assessment  SLP Recommendation/Assessment: All further Speech Lanaguage Pathology  needs can be addressed in the next venue of care SLP Visit Diagnosis: Cognitive communication deficit (R41.841)    Follow Up Recommendations  Home health SLP    Frequency and Duration           SLP Evaluation Cognition  Overall Cognitive Status: Impaired/Different from baseline Arousal/Alertness: Awake/alert Orientation Level: Oriented X4 Attention: Sustained Sustained Attention: Appears intact Memory: Impaired Memory Impairment: Storage deficit;Decreased recall of new information;Decreased short term memory Decreased Short Term Memory: Verbal basic;Functional basic Awareness: Appears intact Problem Solving: Impaired Problem Solving Impairment: Verbal basic;Functional basic Executive Function: Sequencing;Self Monitoring;Self Correcting Sequencing: Impaired Sequencing Impairment: Verbal basic;Functional basic Self Monitoring: Impaired Self Monitoring Impairment: Verbal basic;Functional basic Self Correcting: Impaired Self Correcting Impairment: Verbal basic;Functional basic Safety/Judgment: Appears intact       Comprehension  Auditory Comprehension Overall Auditory Comprehension: Appears within functional limits for tasks assessed Visual Recognition/Discrimination Discrimination: Not tested Reading Comprehension Reading Status: Not tested    Expression Expression Primary Mode of Expression: Verbal Verbal Expression Overall Verbal Expression: Appears within functional limits for tasks assessed Naming:  Impairment Other Naming Comments: generative naming f words 7 in 1 minute Written Expression Dominant Hand: Right Written Expression: Not tested   Oral / Motor  Oral Motor/Sensory Function  Overall Oral Motor/Sensory Function: Within functional limits Motor Speech Overall Motor Speech: Appears within functional limits for tasks assessed   GO          Functional Assessment Tool Used: skilled clinical judgment Functional Limitations: Memory Memory Current Status (U2353): At least 20 percent but less than 40 percent impaired, limited or restricted Memory Goal Status (I1443): At least 20 percent but less than 40 percent impaired, limited or restricted Memory Discharge Status [phone]): At least 20 percent but less than 40 percent impaired, limited or restricted        Deneise Lever, [place], [place] Speech-Language Pathologist [phone]  Aliene Altes 05/18/2017, 3:42 PM

## 2017-05-24 NOTE — Progress Notes (Signed)
Patient ready for discharge to home; discharge instructions given and reviewed;Rx given. Patient discharged accompanied home by her children

## 2017-05-29 ENCOUNTER — Encounter
Admission: RE | Disposition: A | Discharge status: Home / Self Care | Payer: Self-pay | Source: Ambulatory Visit | Attending: Cardiology

## 2017-05-29 ENCOUNTER — Ambulatory Visit
Admission: RE | Admit: 2017-05-29 | Discharge: 2017-05-29 | Disposition: A | Discharge status: Home / Self Care | Payer: Medicare Other | Source: Ambulatory Visit | Attending: Cardiology | Admitting: Cardiology

## 2017-05-29 ENCOUNTER — Encounter: Payer: Self-pay | Admitting: Cardiology

## 2017-05-29 DIAGNOSIS — Z888 Allergy status to other drugs, medicaments and biological substances status: Secondary | ICD-10-CM | POA: Insufficient documentation

## 2017-05-29 DIAGNOSIS — E785 Hyperlipidemia, unspecified: Secondary | ICD-10-CM | POA: Insufficient documentation

## 2017-05-29 DIAGNOSIS — Z7902 Long term (current) use of antithrombotics/antiplatelets: Secondary | ICD-10-CM | POA: Insufficient documentation

## 2017-05-29 DIAGNOSIS — Z8049 Family history of malignant neoplasm of other genital organs: Secondary | ICD-10-CM | POA: Diagnosis not present

## 2017-05-29 DIAGNOSIS — Z791 Long term (current) use of non-steroidal anti-inflammatories (NSAID): Secondary | ICD-10-CM | POA: Insufficient documentation

## 2017-05-29 DIAGNOSIS — Z8673 Personal history of transient ischemic attack (TIA), and cerebral infarction without residual deficits: Secondary | ICD-10-CM | POA: Diagnosis not present

## 2017-05-29 DIAGNOSIS — E1142 Type 2 diabetes mellitus with diabetic polyneuropathy: Secondary | ICD-10-CM | POA: Diagnosis not present

## 2017-05-29 DIAGNOSIS — Z800 Family history of malignant neoplasm of digestive organs: Secondary | ICD-10-CM | POA: Diagnosis not present

## 2017-05-29 DIAGNOSIS — Z8542 Personal history of malignant neoplasm of other parts of uterus: Secondary | ICD-10-CM | POA: Diagnosis not present

## 2017-05-29 DIAGNOSIS — F329 Major depressive disorder, single episode, unspecified: Secondary | ICD-10-CM | POA: Diagnosis not present

## 2017-05-29 DIAGNOSIS — Z8249 Family history of ischemic heart disease and other diseases of the circulatory system: Secondary | ICD-10-CM | POA: Diagnosis not present

## 2017-05-29 DIAGNOSIS — Z794 Long term (current) use of insulin: Secondary | ICD-10-CM | POA: Insufficient documentation

## 2017-05-29 DIAGNOSIS — Z803 Family history of malignant neoplasm of breast: Secondary | ICD-10-CM | POA: Insufficient documentation

## 2017-05-29 DIAGNOSIS — Z833 Family history of diabetes mellitus: Secondary | ICD-10-CM | POA: Insufficient documentation

## 2017-05-29 DIAGNOSIS — I10 Essential (primary) hypertension: Secondary | ICD-10-CM | POA: Diagnosis present

## 2017-05-29 DIAGNOSIS — E669 Obesity, unspecified: Secondary | ICD-10-CM | POA: Insufficient documentation

## 2017-05-29 DIAGNOSIS — K219 Gastro-esophageal reflux disease without esophagitis: Secondary | ICD-10-CM | POA: Diagnosis not present

## 2017-05-29 DIAGNOSIS — Z801 Family history of malignant neoplasm of trachea, bronchus and lung: Secondary | ICD-10-CM | POA: Insufficient documentation

## 2017-05-29 DIAGNOSIS — J3089 Other allergic rhinitis: Secondary | ICD-10-CM | POA: Diagnosis not present

## 2017-05-29 DIAGNOSIS — Z79899 Other long term (current) drug therapy: Secondary | ICD-10-CM | POA: Insufficient documentation

## 2017-05-29 DIAGNOSIS — Z9889 Other specified postprocedural states: Secondary | ICD-10-CM | POA: Insufficient documentation

## 2017-05-29 DIAGNOSIS — Z7982 Long term (current) use of aspirin: Secondary | ICD-10-CM | POA: Diagnosis not present

## 2017-05-29 DIAGNOSIS — R079 Chest pain, unspecified: Secondary | ICD-10-CM | POA: Insufficient documentation

## 2017-05-29 DIAGNOSIS — Z8051 Family history of malignant neoplasm of kidney: Secondary | ICD-10-CM | POA: Diagnosis not present

## 2017-05-29 HISTORY — PX: LOOP RECORDER INSERTION: EP1214

## 2017-05-29 SURGERY — LOOP RECORDER INSERTION
Anesthesia: Moderate Sedation

## 2017-05-29 MED ORDER — LIDOCAINE-EPINEPHRINE (PF) 1 %-1:200000 IJ SOLN
INTRAMUSCULAR | Status: AC
  Filled 2017-05-29: qty 30

## 2017-05-29 SURGICAL SUPPLY — 2 items
LOOP REVEAL LINQSYS (Prosthesis & Implant Heart) ×3 IMPLANT
PACK LOOP INSERTION (CUSTOM PROCEDURE TRAY) ×3 IMPLANT

## 2017-11-12 ENCOUNTER — Other Ambulatory Visit: Payer: Self-pay | Admitting: Internal Medicine

## 2017-11-12 DIAGNOSIS — Z1231 Encounter for screening mammogram for malignant neoplasm of breast: Secondary | ICD-10-CM

## 2018-04-24 ENCOUNTER — Other Ambulatory Visit: Payer: Self-pay

## 2018-04-24 ENCOUNTER — Encounter: Payer: Self-pay | Admitting: Emergency Medicine

## 2018-04-24 ENCOUNTER — Emergency Department
Admission: EM | Admit: 2018-04-24 | Discharge: 2018-04-24 | Disposition: A | Discharge status: Home / Self Care | Payer: Medicare Other | Attending: Emergency Medicine | Admitting: Emergency Medicine

## 2018-04-24 DIAGNOSIS — I10 Essential (primary) hypertension: Secondary | ICD-10-CM | POA: Diagnosis not present

## 2018-04-24 DIAGNOSIS — Z794 Long term (current) use of insulin: Secondary | ICD-10-CM | POA: Insufficient documentation

## 2018-04-24 DIAGNOSIS — Z8673 Personal history of transient ischemic attack (TIA), and cerebral infarction without residual deficits: Secondary | ICD-10-CM | POA: Insufficient documentation

## 2018-04-24 DIAGNOSIS — H1132 Conjunctival hemorrhage, left eye: Secondary | ICD-10-CM | POA: Diagnosis not present

## 2018-04-24 DIAGNOSIS — Z79899 Other long term (current) drug therapy: Secondary | ICD-10-CM | POA: Diagnosis not present

## 2018-04-24 DIAGNOSIS — E119 Type 2 diabetes mellitus without complications: Secondary | ICD-10-CM | POA: Insufficient documentation

## 2018-04-24 DIAGNOSIS — H10022 Other mucopurulent conjunctivitis, left eye: Secondary | ICD-10-CM | POA: Diagnosis present

## 2018-04-24 NOTE — ED Notes (Signed)
Visual acuity left eye 20/100 and right eye 20/25

## 2018-04-24 NOTE — ED Triage Notes (Signed)
Patient ambulatory to triage with steady gait, without difficulty or distress noted; pt st since yesterday evening noted bleeding to left eye; denies pain, st watery; had cataract removal in May and has been blurry since; had recheck yesterday

## 2018-04-24 NOTE — Discharge Instructions (Addendum)
As we discussed, you have what is called a sub-conjunctival hemorrhage.  Be caused due to trauma to the eye or sometimes it can just happen spontaneously.  It is rarely something that needs intervention or treatment and usually resolves on its own.  However, given your history of surgery 7 weeks ago and the fact that you just saw your ophthalmologist yesterday, we strongly encourage you to either call first thing in the morning for a follow-up appointment today or show up at the clinic, show them your eye, and ask for an appointment today before the weekend to make sure everything is okay.  Please continue using your eyedrops and other medications as prescribed.  Return to the nearest emergency department if you develop any new or worsening symptoms that concern you.

## 2018-04-24 NOTE — ED Provider Notes (Addendum)
[facility] Emergency Department Provider Note  ____________________________________________   First MD Initiated Contact with Patient 04/24/18 0215     (approximate)  I have reviewed the triage vital signs and the nursing notes.   HISTORY  Chief Complaint Eye Problem    HPI Kimberly Duarte is a 77 y.o. female with medical history as listed below which notably includes cataract surgery on her left eye about 7 weeks ago that she says has not improved her vision.  She presents tonight due to bleeding on the inside of the left eye.  She says she had an appointment with her ophthalmologist yesterday and did not have any bleeding at that time.  They did an extensive exam and had multiple numbing drops and various evaluations.  They were talking about needing to do additional surgery but there are no solid plans for that yet.  She was sensitive to the light after the dilation but has had no other pain.  Her eye has been watering minimally since the exam.  When her son saw her this evening he asked her why her eye was all red and she looked in the mirror and saw that the eye appears to be bleeding on the inside.  She has no pain or discomfort.  Her vision is unchanged from baseline.  She is still slightly sensitive to light but it is unclear if that is new or chronic.  Nothing particular makes the redness in the eye better or worse.  Onset was acute and she describes it as severe.  No history of foreign body or trauma.  Past Medical History:  Diagnosis Date  . Depression   . Diabetes mellitus without complication ([place])   . Diabetic peripheral neuropathy ([place])   . GERD (gastroesophageal reflux disease)   . Hyperlipidemia   . Hypertension   . Obesity   . Stroke ([place])   . Uterine cancer [facility])     Patient Active Problem List   Diagnosis Date Noted  . Acute left PCA stroke ([place]) 05/16/2017  . DM (diabetes mellitus) ([place]) 05/16/2017  . HTN (hypertension) 05/16/2017   . CVA (cerebral vascular accident) ([place]) 11/27/2016    Past Surgical History:  Procedure Laterality Date  . ABDOMINAL HYSTERECTOMY    . COLONOSCOPY    . COLONOSCOPY WITH PROPOFOL N/A 09/04/2015   Procedure: COLONOSCOPY WITH PROPOFOL;  Surgeon: Manya Silvas, MD;  Location: [facility] ENDOSCOPY;  Service: Endoscopy;  Laterality: N/A;  . ESOPHAGOGASTRODUODENOSCOPY (EGD) WITH PROPOFOL N/A 09/04/2015   Procedure: ESOPHAGOGASTRODUODENOSCOPY (EGD) WITH PROPOFOL;  Surgeon: Manya Silvas, MD;  Location: [facility] ENDOSCOPY;  Service: Endoscopy;  Laterality: N/A;  . LOOP RECORDER INSERTION N/A 05/29/2017   Procedure: LOOP RECORDER INSERTION;  Surgeon: Isaias Cowman, MD;  Location: [place] CV LAB;  Service: Cardiovascular;  Laterality: N/A;  . UPPER GI ENDOSCOPY      Prior to Admission medications   Medication Sig Start Date End Date Taking? Authorizing Provider  aspirin 325 MG tablet Take 325 mg by mouth daily.    [provider]  atorvastatin (LIPITOR) 80 MG tablet Take 1 tablet (80 mg total) by mouth at bedtime. 05/18/17   Jani Gravel, MD  citalopram (CELEXA) 20 MG tablet Take 20 mg by mouth daily.    [provider]  clopidogrel (PLAVIX) 75 MG tablet Take 1 tablet (75 mg total) by mouth daily. 02/16/17   Bettey Costa, MD  glipiZIDE (GLUCOTROL) 10 MG tablet Take 10 mg by mouth 2 (two) times  daily.     [provider]  hydrochlorothiazide (MICROZIDE) 12.5 MG capsule Take 12.5 mg by mouth daily.    [provider]  insulin lispro (HUMALOG) 100 UNIT/ML KiwkPen Inject 14 Units into the skin 3 (three) times daily.  04/07/17   [provider]  insulin NPH Human (HUMULIN N,NOVOLIN N) 100 UNIT/ML injection Inject 15-30 Units into the skin 3 (three) times daily. tk 30 units qam and 30 units qhs, and if sugar is high during the afternoon, tk an additional 15 units    [provider]  lisinopril (PRINIVIL,ZESTRIL) 20 MG tablet Take 20 mg by mouth daily.     [provider]  meloxicam (MOBIC) 15 MG tablet Take 15 mg by mouth daily.    [provider]  metFORMIN (GLUCOPHAGE) 1000 MG tablet Take 1,000 mg by mouth 2 (two) times daily with a meal.    [provider]  omeprazole (PRILOSEC) 20 MG capsule Take 20 mg by mouth daily as needed.     [provider]  pregabalin (LYRICA) 225 MG capsule Take 225 mg by mouth 2 (two) times daily.    [provider]  rOPINIRole (REQUIP) 0.5 MG tablet Take 0.5 mg by mouth every evening.    [provider]    Allergies Actos [pioglitazone]; Byetta 10 mcg pen [exenatide]; and Victoza [liraglutide]  Family History  Problem Relation Age of Onset  . Breast cancer Sister 70  . Colon cancer Mother   . Heart disease Father     Social History Social History   Tobacco Use  . Smoking status: Never Smoker  . Smokeless tobacco: Never Used  Substance Use Topics  . Alcohol use: No  . Drug use: No    Review of Systems Constitutional: No fever/chills Eyes: Acute onset severe redness in the left eye with no pain or visual changes from baseline Respiratory: Denies shortness of breath. Gastrointestinal: No abdominal pain.  No nausea, no vomiting.  Musculoskeletal: Negative for neck pain.  Negative for back pain. Integumentary: Negative for rash. Neurological: Negative for headaches, focal weakness or numbness.   ____________________________________________   PHYSICAL EXAM:  VITAL SIGNS: ED Triage Vitals  Enc Vitals Group     BP 04/24/18 0130 (!) 154/73     Pulse Rate 04/24/18 0130 61     Resp 04/24/18 0130 18     Temp 04/24/18 0130 98.1 F (36.7 C)     Temp Source 04/24/18 0130 Oral     SpO2 04/24/18 0130 97 %     Weight 04/24/18 0131 81.2 kg (179 lb)     Height 04/24/18 0131 1.6 m (5\' 3" )     Head Circumference --      Peak Flow --      Pain Score 04/24/18 0130 0     Pain Loc --      Pain Edu? --      Excl. in [place]? --     Constitutional:  Alert and oriented. Well appearing and in no acute distress. Eyes: Patient has a subconjunctival hemorrhage to the medial and inferior aspects of the left eye.  The globe is appropriately soft and not at all wooden or tender.  Patient denies any visual changes.  Right eye is normal in appearance with normal pupillary reflex but the left pupil is slightly abnormal in appearance likely secondary to her recent surgery.  Extraocular motion is intact and normal bilaterally. Head: Atraumatic. Cardiovascular: Normal rate, regular rhythm. Good peripheral circulation. Respiratory: Normal  respiratory effort.  No retractions.  Musculoskeletal: No lower extremity tenderness nor edema. No gross deformities of extremities. Neurologic:  Normal speech and language. No gross focal neurologic deficits are appreciated.  Skin:  Skin is warm, dry and intact. No rash noted. Psychiatric: Mood and affect are normal. Speech and behavior are normal.  ____________________________________________   LABS (all labs ordered are listed, but only abnormal results are displayed)  Labs Reviewed - No data to display ____________________________________________  EKG  None - EKG not ordered by ED physician ____________________________________________  RADIOLOGY   ED MD interpretation:  No indication for imaging  Official radiology report(s): No results found.  ____________________________________________   PROCEDURES  Critical Care performed: No   Procedure(s) performed:   Procedures   ____________________________________________   INITIAL IMPRESSION / ASSESSMENT AND PLAN / ED COURSE  As part of my medical decision making, I reviewed the following data within the [place] notes reviewed and incorporated and Old chart reviewed    The fact that the some conjunctival hemorrhage developed after extensive exams at the ophthalmologist yesterday suggest to me that this might be a  traumatic subtle conjunctival hemorrhage, though spontaneous is also possible.  She is having no pain which is reassuring, and her visual acuity is poor out of that I have baseline and seems unchanged to her.  The globe is soft and there is no concern at this time for acute angle-closure glaucoma or penetrating globe injury.  She is already well-established with an ophthalmologist in [place] and I explained to her about some conjunctival hemorrhages and the fact that they usually resolve on their own, but I recommended that she either call first thing in the morning or simply show up at the clinic and show them her eye and explained that this developed after seeing the doctor yesterday and that she needs an appointment today before the weekend.  She is comfortable doing that.  She is in no distress and was simply concerned by the appearance of the eye but has no other symptoms.  No indication for further evaluation at this time and I worry that anything else I were to do for could either complicate the exam later this morning or could actually worsen her symptoms because I do not know what complication she is having from her cataract surgery that appears to have been relatively unsuccessful in improving her vision.  She agrees with the plan and will go to her eye doctor later this morning.  I also gave her follow-up information at the [facility] in case for some reason she is unable to go to her regular ophthalmologist.  I also gave her return precautions if symptoms worsen and she agrees with the plan. Clinical Course as of Apr 24 302  Fri Apr 24, 2018  0302 Of note, the patient takes Plavix and aspirin, which likely is related to the spontaneous bleeding.   [CF]    Clinical Course User Index [CF] Hinda Kehr, MD    ____________________________________________  FINAL CLINICAL IMPRESSION(S) / ED DIAGNOSES  Final diagnoses:  Subconjunctival hemorrhage of left eye     MEDICATIONS  GIVEN DURING THIS VISIT:  Medications - No data to display   ED Discharge Orders    None       Note:  This document was prepared using Dragon voice recognition software and may include unintentional dictation errors.    Hinda Kehr, MD 04/24/18 [place] Mould    Hinda Kehr, MD 04/24/18 [phone]

## 2018-12-08 IMAGING — CT CT ANGIO HEAD
1 of 12 series · 5 of 33 positions shown · IV contrast (APPLIED)
Comparison: MRI same day

CLINICAL DATA: New infarction in the left PCA territory.

EXAM:
CT ANGIOGRAPHY HEAD AND NECK
TECHNIQUE: Multidetector CT imaging of the head and neck was performed using
the standard protocol during bolus administration of intravenous
contrast. Multiplanar CT image reconstructions and MIPs were
obtained to evaluate the vascular anatomy. Carotid stenosis
measurements (when applicable) are obtained utilizing NASCET
criteria, using the distal internal carotid diameter as the
denominator.
CONTRAST:  50 cc Isovue 370

[Series 11: ax thins · axial · 0.39mm/px · z∈[-317,-85]mm · 5 of 350 slices shown]
[im 59/350  soft-tissue]
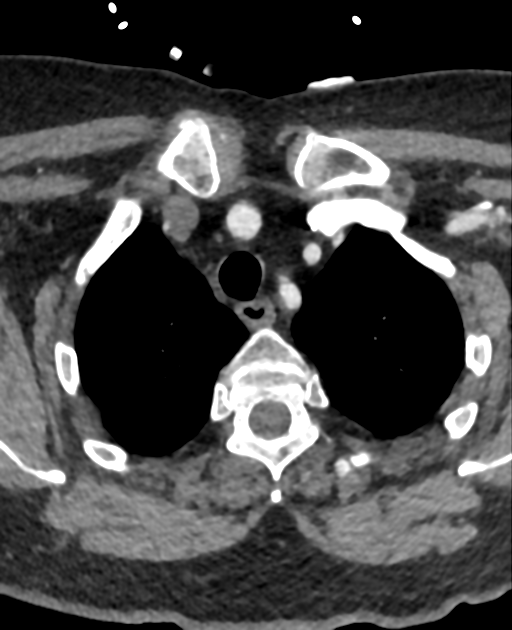
[im 117/350  bone]
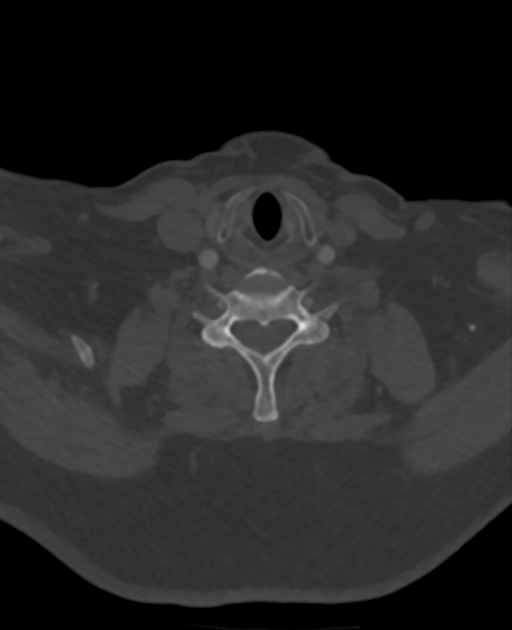
[im 175/350  soft-tissue]
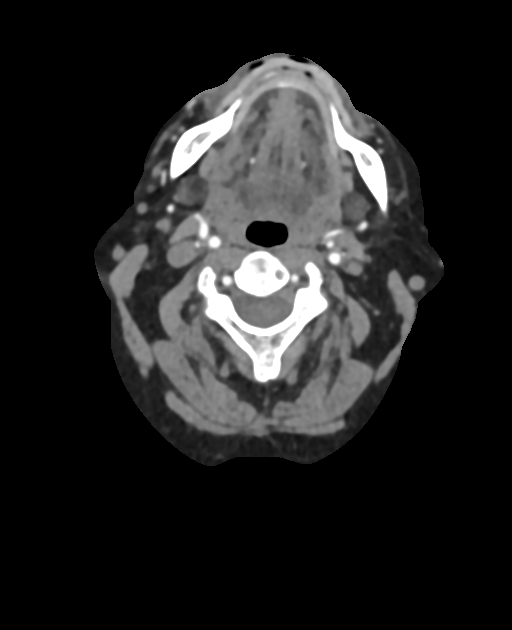
[im 233/350  bone]
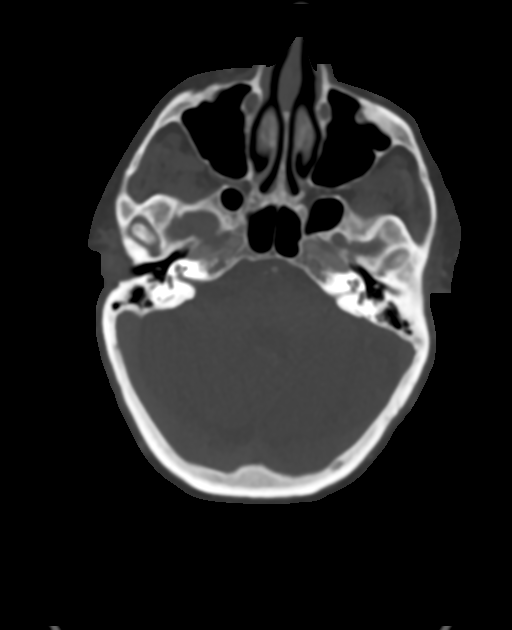
[im 291/350  soft-tissue]
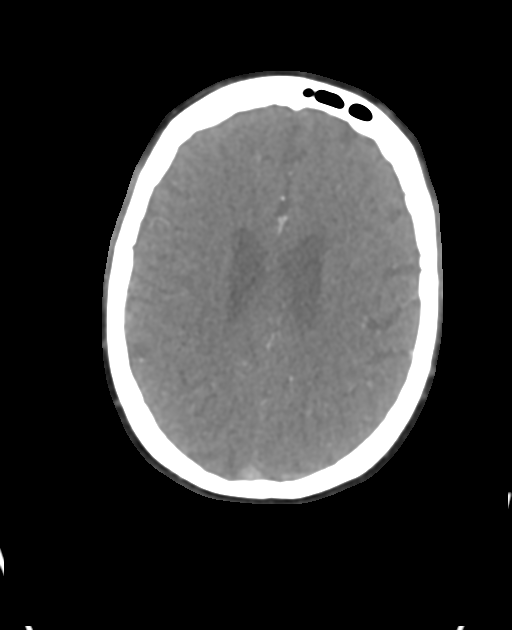

[5 of 33 positions shown; findings below may reference images not displayed]

FINDINGS: CT HEAD FINDINGS

Brain: Subtle evidence of low-density in the left PCA territory as
shown on the MRI. Old infarction in the left lateral thalamus. No
sign of mass lesion, hemorrhage, hydrocephalus or extra-axial
collection.

Vascular: Negative

Skull: Negative

Sinuses: Clear

Orbits: Negative

Review of the MIP images confirms the above findings

CTA NECK FINDINGS

Aortic arch: Aortic atherosclerosis. No aneurysm or dissection.
Branching pattern of the brachiocephalic vessels from the arch is
normal.

Right carotid system: Common carotid artery widely patent to the
bifurcation region. Mild atherosclerotic disease at the carotid
bifurcation and ICA bulb but no stenosis or irregularity. Cervical
ICA widely patent.

Left carotid system: Left common carotid artery widely patent to the
bifurcation region. More extensive atherosclerotic disease at the
left carotid bifurcation and proximal ICA bulb. Minimal diameter of
the proximal ICA measures 3.5 mm. Compared to a more distal cervical
ICA diameter of 5 mm, this indicates a 30% stenosis. Cervical ICA
widely patent beyond that.

Vertebral arteries: Right vertebral artery is dominant. The origin
is widely patent. The right vertebral artery is widely patent
through the cervical region without stenosis or irregularity. Left
vertebral artery is non dominant. The vertebral artery origin is
widely patent in the vessel is widely patent through the cervical
region to the foramen magnum.

Skeleton: Ordinary spondylosis and facet osteoarthritis.

Other neck: No mass or lymphadenopathy.

Upper chest: Negative.

Review of the MIP images confirms the above findings

CTA HEAD FINDINGS

Anterior circulation: Both internal carotid arteries are patent
through the skullbase and siphon regions. There is peripheral
calcification in the carotid siphon regions but no stenosis more
than 30-40%. Anterior and middle cerebral vessels are patent without
proximal stenosis, aneurysm or vascular malformation. Patent
posterior communicating arteries bilaterally.

Posterior circulation: Both vertebral arteries are patent at the
foramen magnum. Both posterior inferior cerebellar arteries are
patent. Both vertebral arteries give supply to the basilar. There is
no basilar stenosis. Superior cerebellar arteries are patent
bilaterally. Right PCA is patent. Left PCA is occluded 9 mm beyond
its origin.

Venous sinuses: Patent and normal.

Anatomic variants: None significant

Delayed phase: No abnormal enhancement

Review of the MIP images confirms the above findings
IMPRESSION: Aortic atherosclerosis without pronounced irregularity, aneurysm or
dissection.

Atherosclerotic disease at both carotid bifurcations. No stenosis on
the right. 30-40% stenosis of the proximal ICA on the left.

Vertebral artery origins are widely patent and both vertebral
arteries are widely patent to the basilar. There is occlusion of the
left PCA 9 mm beyond its origin. The posterior cerebral arteries
receive contribution from the basilar tip but also from bilateral
small posterior communicating arteries which are patent. Therefore,
occlusion of the left PCA could be due to embolic disease from the
anterior or posterior circulation.

## 2020-01-15 ENCOUNTER — Ambulatory Visit: Payer: Medicare PPO | Attending: Internal Medicine

## 2020-01-15 DIAGNOSIS — Z23 Encounter for immunization: Secondary | ICD-10-CM

## 2020-01-15 NOTE — Progress Notes (Signed)
   Covid-19 Vaccination Clinic  Name:  Kimberly Duarte    MRN: [id number] DOB: [birth year]  01/15/2020  Kimberly Duarte was observed post Covid-19 immunization for 15 minutes without incident. She was provided with Vaccine Information Sheet and instruction to access the V-Safe system.   Kimberly Duarte was instructed to call 911 with any severe reactions post vaccine: Marland Kitchen Difficulty breathing  . Swelling of face and throat  . A fast heartbeat  . A bad rash all over body  . Dizziness and weakness   Immunizations Administered    Name Date Dose VIS Date Route   Pfizer COVID-19 Vaccine 01/15/2020 11:08 AM 0.3 mL 09/10/2019 Intramuscular   Manufacturer: [employer], [employer]   Lot: [id number]   [place]: [id number]

## 2020-02-09 ENCOUNTER — Ambulatory Visit: Payer: Medicare PPO | Attending: Internal Medicine

## 2020-02-09 DIAGNOSIS — Z23 Encounter for immunization: Secondary | ICD-10-CM

## 2020-02-09 NOTE — Progress Notes (Signed)
   Covid-19 Vaccination Clinic  Name:  Kimberly Duarte    MRN: [id number] DOB: 08/21/1941  02/09/2020  [name] was observed post Covid-19 immunization for 15 minutes without incident. She was provided with Vaccine Information Sheet and instruction to access the V-Safe system.   [name] was instructed to call 911 with any severe reactions post vaccine: Marland Kitchen Difficulty breathing  . Swelling of face and throat  . A fast heartbeat  . A bad rash all over body  . Dizziness and weakness   Immunizations Administered    Name Date Dose VIS Date Route   Pfizer COVID-19 Vaccine 02/09/2020  9:32 AM 0.3 mL 11/24/2018 Intramuscular   Manufacturer: [place]   Lot: [id number]   [place]: [id number]

## 2020-09-18 ENCOUNTER — Other Ambulatory Visit: Payer: Self-pay | Admitting: Internal Medicine

## 2020-09-18 DIAGNOSIS — Z1231 Encounter for screening mammogram for malignant neoplasm of breast: Secondary | ICD-10-CM

## 2021-09-26 ENCOUNTER — Other Ambulatory Visit: Payer: Self-pay | Admitting: Nephrology

## 2021-09-26 DIAGNOSIS — E1122 Type 2 diabetes mellitus with diabetic chronic kidney disease: Secondary | ICD-10-CM

## 2021-09-26 DIAGNOSIS — N1832 Chronic kidney disease, stage 3b: Secondary | ICD-10-CM

## 2021-10-04 ENCOUNTER — Ambulatory Visit
Admission: RE | Admit: 2021-10-04 | Discharge: 2021-10-04 | Disposition: A | Discharge status: Home / Self Care | Payer: Medicare PPO | Source: Ambulatory Visit | Attending: Nephrology | Admitting: Nephrology

## 2021-10-04 ENCOUNTER — Other Ambulatory Visit: Payer: Self-pay

## 2021-10-04 DIAGNOSIS — E1122 Type 2 diabetes mellitus with diabetic chronic kidney disease: Secondary | ICD-10-CM | POA: Insufficient documentation

## 2021-10-04 DIAGNOSIS — N1832 Chronic kidney disease, stage 3b: Secondary | ICD-10-CM | POA: Diagnosis present

## 2021-12-28 ENCOUNTER — Emergency Department: Payer: Medicare PPO

## 2021-12-28 ENCOUNTER — Other Ambulatory Visit: Payer: Self-pay

## 2021-12-28 ENCOUNTER — Observation Stay
Admission: EM | Admit: 2021-12-28 | Discharge: 2021-12-29 | Disposition: A | Discharge status: Home / Self Care | Payer: Medicare PPO | Attending: Emergency Medicine | Admitting: Emergency Medicine

## 2021-12-28 ENCOUNTER — Observation Stay: Payer: Medicare PPO

## 2021-12-28 DIAGNOSIS — Z7984 Long term (current) use of oral hypoglycemic drugs: Secondary | ICD-10-CM | POA: Diagnosis not present

## 2021-12-28 DIAGNOSIS — R200 Anesthesia of skin: Secondary | ICD-10-CM

## 2021-12-28 DIAGNOSIS — G459 Transient cerebral ischemic attack, unspecified: Secondary | ICD-10-CM | POA: Diagnosis not present

## 2021-12-28 DIAGNOSIS — Z7902 Long term (current) use of antithrombotics/antiplatelets: Secondary | ICD-10-CM | POA: Insufficient documentation

## 2021-12-28 DIAGNOSIS — R531 Weakness: Secondary | ICD-10-CM

## 2021-12-28 DIAGNOSIS — E1122 Type 2 diabetes mellitus with diabetic chronic kidney disease: Secondary | ICD-10-CM | POA: Insufficient documentation

## 2021-12-28 DIAGNOSIS — I63212 Cerebral infarction due to unspecified occlusion or stenosis of left vertebral arteries: Secondary | ICD-10-CM

## 2021-12-28 DIAGNOSIS — Z8541 Personal history of malignant neoplasm of cervix uteri: Secondary | ICD-10-CM | POA: Insufficient documentation

## 2021-12-28 DIAGNOSIS — N183 Chronic kidney disease, stage 3 unspecified: Secondary | ICD-10-CM | POA: Insufficient documentation

## 2021-12-28 DIAGNOSIS — E1159 Type 2 diabetes mellitus with other circulatory complications: Secondary | ICD-10-CM

## 2021-12-28 DIAGNOSIS — Z794 Long term (current) use of insulin: Secondary | ICD-10-CM | POA: Diagnosis not present

## 2021-12-28 DIAGNOSIS — Z7982 Long term (current) use of aspirin: Secondary | ICD-10-CM | POA: Diagnosis not present

## 2021-12-28 DIAGNOSIS — I129 Hypertensive chronic kidney disease with stage 1 through stage 4 chronic kidney disease, or unspecified chronic kidney disease: Secondary | ICD-10-CM | POA: Insufficient documentation

## 2021-12-28 DIAGNOSIS — Z79899 Other long term (current) drug therapy: Secondary | ICD-10-CM | POA: Diagnosis not present

## 2021-12-28 DIAGNOSIS — Z8673 Personal history of transient ischemic attack (TIA), and cerebral infarction without residual deficits: Secondary | ICD-10-CM | POA: Insufficient documentation

## 2021-12-28 DIAGNOSIS — I10 Essential (primary) hypertension: Secondary | ICD-10-CM

## 2021-12-28 LAB — CBC
HCT: 39.7 % (ref 36.0–46.0)
Hemoglobin: 12.5 g/dL (ref 12.0–15.0)
MCH: 29.6 pg (ref 26.0–34.0)
MCHC: 31.5 g/dL (ref 30.0–36.0)
MCV: 94.1 fL (ref 80.0–100.0)
Platelets: 162 10*3/uL (ref 150–400)
RBC: 4.22 MIL/uL (ref 3.87–5.11)
RDW: 13.2 % (ref 11.5–15.5)
WBC: 7.5 10*3/uL (ref 4.0–10.5)
nRBC: 0 % (ref 0.0–0.2)

## 2021-12-28 LAB — URINALYSIS, COMPLETE (UACMP) WITH MICROSCOPIC
Bacteria, UA: NONE SEEN
Bilirubin Urine: NEGATIVE
Glucose, UA: >=500 mg/dL — AB
Hgb urine dipstick: NEGATIVE
Ketones, ur: NEGATIVE mg/dL
Leukocytes,Ua: NEGATIVE
Nitrite: NEGATIVE
Protein, ur: NEGATIVE mg/dL
Specific Gravity, Urine: 1.01 (ref 1.005–1.030)
Squamous Epithelial / HPF: NONE SEEN (ref 0–5)
WBC, UA: NONE SEEN WBC/hpf (ref 0–5)
pH: 7 (ref 5.0–8.0)

## 2021-12-28 LAB — APTT: aPTT: 35 seconds (ref 24–36)

## 2021-12-28 LAB — DIFFERENTIAL
Abs Immature Granulocytes: 0.02 10*3/uL (ref 0.00–0.07)
Basophils Absolute: 0 10*3/uL (ref 0.0–0.1)
Basophils Relative: 0 %
Eosinophils Absolute: 0.1 10*3/uL (ref 0.0–0.5)
Eosinophils Relative: 1 %
Immature Granulocytes: 0 %
Lymphocytes Relative: 16 %
Lymphs Abs: 1.2 10*3/uL (ref 0.7–4.0)
Monocytes Absolute: 0.6 10*3/uL (ref 0.1–1.0)
Monocytes Relative: 8 %
Neutro Abs: 5.6 10*3/uL (ref 1.7–7.7)
Neutrophils Relative %: 75 %

## 2021-12-28 LAB — URINE DRUG SCREEN, QUALITATIVE (ARMC ONLY)
Amphetamines, Ur Screen: NOT DETECTED
Barbiturates, Ur Screen: NOT DETECTED
Benzodiazepine, Ur Scrn: NOT DETECTED
Cannabinoid 50 Ng, Ur ~~LOC~~: NOT DETECTED
Cocaine Metabolite,Ur ~~LOC~~: NOT DETECTED
MDMA (Ecstasy)Ur Screen: NOT DETECTED
Methadone Scn, Ur: NOT DETECTED
Opiate, Ur Screen: NOT DETECTED
Phencyclidine (PCP) Ur S: NOT DETECTED
Tricyclic, Ur Screen: NOT DETECTED

## 2021-12-28 LAB — ETHANOL: Alcohol, Ethyl (B): <10 mg/dL (ref <10)

## 2021-12-28 LAB — COMPREHENSIVE METABOLIC PANEL
ALT: 18 U/L (ref 0–44)
AST: 18 U/L (ref 15–41)
Albumin: 3.6 g/dL (ref 3.5–5.0)
Alkaline Phosphatase: 62 U/L (ref 38–126)
Anion gap: 8 (ref 5–15)
BUN: 27 mg/dL — ABNORMAL HIGH (ref 8–23)
CO2: 25 mmol/L (ref 22–32)
Calcium: 9.2 mg/dL (ref 8.9–10.3)
Chloride: 110 mmol/L (ref 98–111)
Creatinine, Ser: 1.49 mg/dL — ABNORMAL HIGH (ref 0.44–1.00)
GFR, Estimated: 36 mL/min — ABNORMAL LOW (ref >60)
Glucose, Bld: 76 mg/dL (ref 70–99)
Potassium: 4.5 mmol/L (ref 3.5–5.1)
Sodium: 143 mmol/L (ref 135–145)
Total Bilirubin: 0.7 mg/dL (ref 0.3–1.2)
Total Protein: 6.9 g/dL (ref 6.5–8.1)

## 2021-12-28 LAB — CBG MONITORING, ED: Glucose-Capillary: 71 mg/dL (ref 70–99)

## 2021-12-28 LAB — PROTIME-INR
INR: 1 (ref 0.8–1.2)
Prothrombin Time: 13.4 seconds (ref 11.4–15.2)

## 2021-12-28 MED ORDER — DONEPEZIL HCL 5 MG PO TABS
10.0000 mg | ORAL_TABLET | Freq: Every day | ORAL | Status: DC
  Administered 2021-12-29: 10 mg via ORAL
  Filled 2021-12-28: qty 2

## 2021-12-28 MED ORDER — ACETAMINOPHEN 650 MG RE SUPP: 650.0000 mg | RECTAL | Status: DC | PRN

## 2021-12-28 MED ORDER — ATORVASTATIN CALCIUM 20 MG PO TABS: 80.0000 mg | ORAL_TABLET | Freq: Every day | ORAL | Status: DC

## 2021-12-28 MED ORDER — STROKE: EARLY STAGES OF RECOVERY BOOK: Freq: Once | Status: AC

## 2021-12-28 MED ORDER — ACETAMINOPHEN 325 MG PO TABS: 650.0000 mg | ORAL_TABLET | ORAL | Status: DC | PRN

## 2021-12-28 MED ORDER — ACETAMINOPHEN 160 MG/5ML PO SOLN
650.0000 mg | ORAL | Status: DC | PRN
Start: 2021-12-28 — End: 2021-12-29
  Filled 2021-12-28: qty 20.3

## 2021-12-28 MED ORDER — INSULIN ASPART 100 UNIT/ML IJ SOLN
4.0000 [IU] | Freq: Three times a day (TID) | INTRAMUSCULAR | Status: DC
  Administered 2021-12-29 (×2): 4 [IU] via SUBCUTANEOUS
  Filled 2021-12-28 (×3): qty 1

## 2021-12-28 MED ORDER — CLOPIDOGREL BISULFATE 75 MG PO TABS
75.0000 mg | ORAL_TABLET | Freq: Every day | ORAL | Status: DC
  Administered 2021-12-29: 75 mg via ORAL
  Filled 2021-12-28: qty 1

## 2021-12-28 MED ORDER — IOHEXOL 350 MG/ML SOLN
75.0000 mL | Freq: Once | INTRAVENOUS | Status: AC | PRN
  Administered 2021-12-28: 75 mL via INTRAVENOUS

## 2021-12-28 MED ORDER — SODIUM CHLORIDE 0.9 % IV BOLUS
1000.0000 mL | Freq: Once | INTRAVENOUS | Status: AC
  Administered 2021-12-28: 1000 mL via INTRAVENOUS

## 2021-12-28 MED ORDER — INSULIN ASPART 100 UNIT/ML IJ SOLN
0.0000 [IU] | Freq: Three times a day (TID) | INTRAMUSCULAR | Status: DC
  Administered 2021-12-29: 1 [IU] via SUBCUTANEOUS
  Filled 2021-12-28 (×2): qty 1

## 2021-12-28 NOTE — ED Notes (Signed)
Notified lab to add ethanol to the blood I drew less than one hour ago. ?

## 2021-12-28 NOTE — ED Notes (Signed)
Patient to MRI via stretcher.

## 2021-12-28 NOTE — ED Notes (Signed)
Patient assisted to bedside commode due to ongoing numbness to left side of her body. She also states she had previous weakness to left side from previous CVA. Patient ambulates well. Patient provided with warm blanket and lights dimmed. Patient requesting food and beverage but will be held until orders received from inpatient provider. Patient verbalized understanding. ?

## 2021-12-28 NOTE — ED Triage Notes (Signed)
Pt reports this am around 0530 the entire left side of her body went numb. Pt reports feels more pronounced in her face. Pt states numbness has lasted all day. Pt able to speak in complete sentences, no facial drooping noted. Pt last known well was last pm before bed around 0200 am ?

## 2021-12-28 NOTE — ED Provider Notes (Signed)
? ?[facility] ?Provider Note ? ? ? Event Date/Time  ? First MD Initiated Contact with Patient 12/28/21 1718   ?  (approximate) ? ? ?History  ? ?Chief Complaint ?Numbness and Weakness ? ? ?HPI ?Kimberly Duarte is a 81 y.o. female, history of diabetes, hypertension, stroke, depression, obesity, hyperlipidemia, presents to the emergency department for evaluation of left-sided numbness.  Patient states that she has been experiencing numbness along the left side of her body that started around 0530 this morning.  She states it is more pronounced in her face.  She is not currently in any pain at this time.  She states that she has had a stroke in the past and this has felt similar.  She additionally states that she sees flashes and floaters on the left side of her vision.  Denies headache, falls, recent illnesses, neck pain, cough, congestion, chest pain, shortness of breath, abdominal pain, nausea/vomiting, urinary symptoms, or dizziness.  Currently on Plavix and aspirin, but no blood thinners. ? ?History Limitations: No limitations. ? ?  ? ? ?Physical Exam  ?Triage Vital Signs: ?ED Triage Vitals  ?Enc Vitals Group  ?   BP 12/28/21 1704 (!) 130/55  ?   Pulse Rate 12/28/21 1704 67  ?   Resp 12/28/21 1704 18  ?   Temp 12/28/21 1704 98 ?F (36.7 ?C)  ?   Temp src --   ?   SpO2 12/28/21 1704 100 %  ?   Weight 12/28/21 1704 180 lb (81.6 kg)  ?   Height 12/28/21 1704 '5\' 3"'$  (1.6 m)  ?   Head Circumference --   ?   Peak Flow --   ?   Pain Score 12/28/21 1703 0  ?   Pain Loc --   ?   Pain Edu? --   ?   Excl. in [place]? --   ? ? ?Most recent vital signs: ?Vitals:  ? 12/28/21 2356 12/29/21 0402  ?BP: (!) 159/74 125/60  ?Pulse: 97 62  ?Resp: 18   ?Temp: 98.1 ?F (36.7 ?C) (!) 97.3 ?F (36.3 ?C)  ?SpO2: 100% 100%  ? ? ?General: Awake, appears uncomfortable. ?Skin: Warm, dry.  ?CV: Good peripheral perfusion.  ?Resp: Normal effort.  Lung sounds are clear bilaterally in the apices and bases. ?Abd: Soft, non-tender. No  distention.  ?Neuro: Cranial nerves II through XII intact, though patient does endorse numbness when palpating the left side of her face, left upper extremity, and left lower extremity.  Patient maintains full range of motion in all 4 extremities, but does have mild weakness in the left upper extremity and left lower extremity.  No remarkable facial droop present.  Patient is able to speak in full sentences.  Normal finger-nose testing/heel-to-shin. ?Other: PERRL.  EOMI. ? ?Physical Exam ? ? ? ?ED Results / Procedures / Treatments  ?Labs ?(all labs ordered are listed, but only abnormal results are displayed) ?Labs Reviewed  ?COMPREHENSIVE METABOLIC PANEL - Abnormal; Notable for the following components:  ?    Result Value  ? BUN 27 (*)   ? Creatinine, Ser 1.49 (*)   ? GFR, Estimated 36 (*)   ? All other components within normal limits  ?URINALYSIS, COMPLETE (UACMP) WITH MICROSCOPIC - Abnormal; Notable for the following components:  ? Color, Urine STRAW (*)   ? APPearance CLEAR (*)   ? Glucose, UA >=500 (*)   ? All other components within normal limits  ?PROTIME-INR  ?APTT  ?CBC  ?DIFFERENTIAL  ?  ETHANOL  ?URINE DRUG SCREEN, QUALITATIVE ([facility] ONLY)  ?LIPID PANEL  ?C-REACTIVE PROTEIN  ?VITAMIN B12  ?FOLATE  ?URINALYSIS, ROUTINE W REFLEX MICROSCOPIC  ?HEMOGLOBIN A1C  ?CBG MONITORING, ED  ? ? ? ?EKG ?Sinus rhythm, rate of 70, no T-segment changes, no AV blocks, no axis deviations, normal QRS interval. ? ? ?RADIOLOGY ? ?ED Provider Interpretation: I personally reviewed and interpreted this head CT, no evidence of acute intracranial findings. ? ?CT ANGIO HEAD NECK W WO CM ? ?Result Date: 12/28/2021 ?CLINICAL DATA:  Initial evaluation for acute stroke. EXAM: CT ANGIOGRAPHY HEAD AND NECK TECHNIQUE: Multidetector CT imaging of the head and neck was performed using the standard protocol during bolus administration of intravenous contrast. Multiplanar CT image reconstructions and MIPs were obtained to evaluate the vascular  anatomy. Carotid stenosis measurements (when applicable) are obtained utilizing NASCET criteria, using the distal internal carotid diameter as the denominator. RADIATION DOSE REDUCTION: This exam was performed according to the departmental dose-optimization program which includes automated exposure control, adjustment of the mA and/or kV according to patient size and/or use of iterative reconstruction technique. CONTRAST:  48m OMNIPAQUE IOHEXOL 350 MG/ML SOLN COMPARISON:  CT from earlier same day and CTA from 05/16/2017. FINDINGS: CTA NECK FINDINGS Aortic arch: Visualized aortic arch normal caliber with normal branch pattern. Mild-to-moderate atheromatous plaque about the arch and origin of the great vessels without significant stenosis. Right carotid system: Right common and internal carotid arteries mildly tortuous without dissection or acute abnormality. Mild plaque about the right carotid bulb/proximal right ICA without significant stenosis. Left carotid system: Left common and internal carotid arteries are tortuous without dissection or other acute abnormality. Mild-to-moderate plaque about the left carotid bulb/proximal left ICA without hemodynamically significant stenosis. Vertebral arteries: Both vertebral arteries arise from subclavian arteries. No proximal subclavian artery stenosis. Vertebral arteries tortuous without dissection or stenosis. Skeleton: Subcentimeter lucent lesion within the T1 vertebral body is stable from prior, consistent with a benign finding. No worrisome osseous lesions. Mild-to-moderate spondylosis at C5-6 and C6-7. Patient is edentulous. Other neck: No other acute soft tissue abnormality within the neck. Upper chest: Few scattered foci of soft tissue emphysema within the anterior upper chest, likely related IV access. Visualized upper chest demonstrates no other acute finding. Review of the MIP images confirms the above findings CTA HEAD FINDINGS Anterior circulation: Petrous  segments patent bilaterally. Atheromatous plaque within the carotid siphons with no more than mild multifocal narrowing. A1 segments patent bilaterally. Normal anterior communicating artery complex. Anterior cerebral arteries patent to their distal aspects without stenosis. No M1 stenosis or occlusion. Normal MCA bifurcations. Distal MCA branches perfused and symmetric. Posterior circulation: Atheromatous irregularity within the V4 segments bilaterally. Associated moderate stenosis at the proximal right V4 segment (series 6, image 199). Right vertebral artery is slightly dominant. Both PICA patent. Basilar patent to its distal aspect without stenosis. Superior cerebellar arteries patent bilaterally. Both PCA supplied via the basilar as well as small bilateral posterior communicating arteries. Atheromatous irregularity within the right PCA with associated moderate multifocal P2 stenoses. The left PCA is severely attenuated with near occlusion with severe multifocal stenoses. PCA does remain grossly patent to its distal aspect. Venous sinuses: Patent allowing for timing the contrast bolus. Anatomic variants: None significant.  No aneurysm. Review of the MIP images confirms the above findings IMPRESSION: 1. Negative CTA for acute large vessel occlusion or other emergent finding. 2. Severely attenuated and nearly occluded left PCA, similar to prior CTA. Left PCA does remain grossly patent to its  distal aspect. 3. Moderate right V4 and multifocal right P2 stenoses. 4. Mild-to-moderate atheromatous change about the carotid bifurcations and carotid siphons without hemodynamically significant stenosis. 5. Diffuse tortuosity of the major arterial vasculature of the neck, suggesting chronic underlying hypertension. Electronically Signed   By: Jeannine Boga M.D.   On: 12/28/2021 22:31  ? ?DG Chest 2 View ? ?Result Date: 12/28/2021 ?CLINICAL DATA:  Left side numbness. EXAM: CHEST - 2 VIEW COMPARISON:  02/17/2017 FINDINGS:  Heart is normal size. No confluent airspace opacities or effusions. No acute bony abnormality. IMPRESSION: No active cardiopulmonary disease. Electronically Signed   By: Rolm Baptise M.D.   On: 12/28/2021 23:1

## 2021-12-28 NOTE — ED Notes (Signed)
Patient assisted to the bedside commode to obtain urine specimen. Patient then assisted back to bed.MRI is here to take patient for testing. ?

## 2021-12-28 NOTE — H&P (Addendum)
?History and Physical  ? ? ?[name] QQV:[id number] DOB: 21-Apr-1941 DOA: 12/28/2021 ? ?PCP: Tracie Harrier, MD  ?Patient coming from: home ? ?I have personally briefly reviewed patient's old medical records in [place] ? ?Chief Complaint: 0530 the entire left side of her body went numb ongoing all day ? ?HPI: Kimberly Duarte is a 81 y.o. 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 Patient notes symptoms ongoing all day so she decided to present to ED. Patient noted no confusion, speech difficulties,difficulty swallowing, vision changes, or muscle weakness. Patient states symptoms started with heavy feeling in her head that she describes as a headache. She then noted her left side started to feel numb. She noted no associated weakness but felt off balanced. Patient states that currently she is still experiencing numbness but its less so than at onset of symptoms. She also notes her HA has resolved. She notes no recent URI symptoms, no sob, cough , chest pain , falls/ trauma,n/v/d/ abdominal pain or poor appetite.  ? ?ED Course:  ?Afeb, bp 130/55, hr 67, rr 18 sat 100% on ra  ?CT head: NAD  ?Glu: 71  ?EKG: nsr, no st -twave changes ?Labs: ?Na 143, K 4.5, cr 1.49 at baseline ?Etoh <10 ?CTA  head and neck pending  ?MRI head pending ? ? ?Review of Systems: As per HPI otherwise 10 point review of systems negative.  ? ?Past Medical History:  ?Diagnosis Date  ? Depression   ? Diabetes mellitus without complication ([place])   ? Diabetic peripheral neuropathy ([place])   ? GERD (gastroesophageal reflux disease)   ? Hyperlipidemia   ? Hypertension   ? Obesity   ?  Stroke [facility])   ? Uterine cancer ([place])   ? ? ?Past Surgical History:  ?Procedure Laterality Date  ? ABDOMINAL HYSTERECTOMY    ? COLONOSCOPY    ? COLONOSCOPY WITH PROPOFOL N/A 09/04/2015  ? Procedure: COLONOSCOPY WITH PROPOFOL;  Surgeon: Manya Silvas, MD;  Location: [facility] ENDOSCOPY;  Service: Endoscopy;  Laterality: N/A;  ? ESOPHAGOGASTRODUODENOSCOPY (EGD) WITH PROPOFOL N/A 09/04/2015  ? Procedure: ESOPHAGOGASTRODUODENOSCOPY (EGD) WITH PROPOFOL;  Surgeon: Manya Silvas, MD;  Location: [facility] ENDOSCOPY;  Service: Endoscopy;  Laterality: N/A;  ? LOOP RECORDER INSERTION N/A 05/29/2017  ? Procedure: LOOP RECORDER INSERTION;  Surgeon: Isaias Cowman, MD;  Location: [place] CV LAB;  Service: Cardiovascular;  Laterality: N/A;  ? UPPER GI ENDOSCOPY    ? ? ? reports that she has never smoked. She has never used smokeless tobacco. She reports that she does not drink alcohol and does not use drugs. ? ?Allergies  ?Allergen Reactions  ? Actos [Pioglitazone] Other (See Comments)  ?  Was unable to tolerate - caused stomach cramps ?Reaction:  Stomach cramps   ? Byetta 10 Mcg Pen [Exenatide] Nausea And Vomiting and Other (See Comments)  ?  Was unable to tolerate - made her sick  ? Victoza [Liraglutide] Other (See Comments)  ?  caused her feet to burn and sting and stopped it ?Reaction:  Burning and stinging of feet  ?  ? ? ?Family History  ?Problem Relation Age of Onset  ? Breast cancer Sister 3  ?  Colon cancer Mother   ? Heart disease Father   ? ? ?Prior to Admission medications   ?Medication Sig Start Date End Date Taking? Authorizing Provider  ?aspirin 325 MG tablet Take 325 mg by mouth daily.    [provider]  ?atorvastatin (LIPITOR) 80 MG tablet Take 1 tablet (80 mg total) by mouth at bedtime. 05/18/17   Jani Gravel, MD  ?citalopram (CELEXA) 20 MG tablet Take 20 mg by mouth daily.    [provider]  ?clopidogrel (PLAVIX) 75 MG tablet Take 1 tablet (75 mg total) by mouth daily. 02/16/17   Bettey Costa, MD  ?glipiZIDE (GLUCOTROL) 10 MG tablet Take 10 mg by mouth 2 (two) times daily.     [provider]  ?hydrochlorothiazide (MICROZIDE) 12.5 MG capsule Take 12.5 mg by mouth daily.    [provider]  ?insulin lispro (HUMALOG) 100 UNIT/ML KiwkPen Inject 14 Units into the skin 3 (three) times daily.  04/07/17   [provider]  ?insulin NPH Human (HUMULIN N,NOVOLIN N) 100 UNIT/ML injection Inject 15-30 Units into the skin 3 (three) times daily. tk 30 units qam and 30 units qhs, and if sugar is high during the afternoon, tk an additional 15 units    [provider]  ?lisinopril (PRINIVIL,ZESTRIL) 20 MG tablet Take 20 mg by mouth daily.    [provider]  ?meloxicam (MOBIC) 15 MG tablet Take 15 mg by mouth daily.    [provider]  ?metFORMIN (GLUCOPHAGE) 1000 MG tablet Take 1,000 mg by mouth 2 (two) times daily with a meal.    [provider]  ?omeprazole (PRILOSEC) 20 MG capsule Take 20 mg by mouth daily as needed.     [provider]  ?pregabalin (LYRICA) 225 MG capsule Take 225 mg by mouth 2 (two) times daily.    [provider]  ?rOPINIRole (REQUIP) 0.5 MG tablet Take 0.5 mg by mouth every evening.    [provider]  ? ? ?Physical Exam: ?Vitals:  ? 12/28/21 1704 12/28/21 1704  ?BP:  (!) 130/55  ?Pulse:  67  ?Resp:  18  ?Temp:  98 ?F (36.7 ?C)  ?SpO2:  100%  ?Weight: 81.6 kg   ?Height: '5\' 3"'$  (1.6 m)   ? ? ? ?Vitals:  ? 12/28/21 1704 12/28/21 1704  ?BP:  (!) 130/55  ?Pulse:  67  ?Resp:  18  ?Temp:  98 ?F (36.7 ?C)  ?SpO2:  100%  ?Weight: 81.6 kg   ?Height: '5\' 3"'$  (1.6 m)   ?Constitutional: NAD, calm, comfortable ?Eyes: PERRL, lids and conjunctivae normal ?ENMT: Mucous membranes are moist. Posterior pharynx clear of any exudate or lesions.Normal dentition.  ?Neck: normal, supple, no masses, no thyromegaly ?Respiratory: clear to auscultation bilaterally, no wheezing, no crackles. Normal respiratory effort. No accessory  muscle use.  ?Cardiovascular: Regular rate and rhythm, no murmurs / rubs / gallops. No extremity edema. 2+ pedal pulses. No carotid bruits.  ?Abdomen: no tenderness, no masses palpated. No hepatosplenomegaly. Bowel sounds positive.  ?Musculoskeletal: no clubbing / cyanosis. No joint deformity upper and lower extremities. Good ROM, no contractures. Normal muscle tone.  ?Skin: no rashes, lesions, ulcers. No induration ?Neurologic: CN 2-12 grossly intact. Sensation intact, Strength 5/5 in all 4.  ?Psychiatric: Normal judgment and insight. Alert and oriented x 3. Normal mood.  ? ? ?Labs on Admission: I have personally reviewed following labs and imaging studies ? ?CBC: ?Recent Labs  ?Lab 12/28/21 ?1708  ?WBC 7.5  ?NEUTROABS 5.6  ?  HGB 12.5  ?HCT 39.7  ?MCV 94.1  ?PLT 162  ? ?Basic Metabolic Panel: ?Recent Labs  ?Lab 12/28/21 ?1708  ?NA 143  ?K 4.5  ?CL 110  ?CO2 25  ?GLUCOSE 76  ?BUN 27*  ?CREATININE 1.49*  ?CALCIUM 9.2  ? ?GFR: ?Estimated Creatinine Clearance: 31 mL/min (A) (by C-G formula based on SCr of 1.49 mg/dL (H)). ?Liver Function Tests: ?Recent Labs  ?Lab 12/28/21 ?1708  ?AST 18  ?ALT 18  ?ALKPHOS 62  ?BILITOT 0.7  ?PROT 6.9  ?ALBUMIN 3.6  ? ?No results for input(s): LIPASE, AMYLASE in the last 168 hours. ?No results for input(s): AMMONIA in the last 168 hours. ?Coagulation Profile: ?Recent Labs  ?Lab 12/28/21 ?1708  ?INR 1.0  ? ?Cardiac Enzymes: ?No results for input(s): CKTOTAL, CKMB, CKMBINDEX, TROPONINI in the last 168 hours. ?BNP (last 3 results) ?No results for input(s): PROBNP in the last 8760 hours. ?HbA1C: ?No results for input(s): HGBA1C in the last 72 hours. ?CBG: ?Recent Labs  ?Lab 12/28/21 ?1705  ?GLUCAP 71  ? ?Lipid Profile: ?No results for input(s): CHOL, HDL, LDLCALC, TRIG, CHOLHDL, LDLDIRECT in the last 72 hours. ?Thyroid Function Tests: ?No results for input(s): TSH, T4TOTAL, FREET4, T3FREE, THYROIDAB in the last 72 hours. ?Anemia Panel: ?No results for input(s): VITAMINB12, FOLATE, FERRITIN,  TIBC, IRON, RETICCTPCT in the last 72 hours. ?Urine analysis: ?   ?Component Value Date/Time  ? COLORURINE STRAW (A) 04/28/2017 2057  ? APPEARANCEUR CLEAR (A) 04/28/2017 2057  ? LABSPEC 1.009 04/28/2017 2057  ? PHU

## 2021-12-29 ENCOUNTER — Observation Stay
Admit: 2021-12-29 | Discharge: 2021-12-29 | Disposition: A | Discharge status: Home / Self Care | Payer: Medicare PPO | Attending: Internal Medicine | Admitting: Internal Medicine

## 2021-12-29 ENCOUNTER — Encounter: Payer: Self-pay | Admitting: Internal Medicine

## 2021-12-29 DIAGNOSIS — N182 Chronic kidney disease, stage 2 (mild): Secondary | ICD-10-CM

## 2021-12-29 DIAGNOSIS — G459 Transient cerebral ischemic attack, unspecified: Secondary | ICD-10-CM | POA: Diagnosis not present

## 2021-12-29 DIAGNOSIS — G4733 Obstructive sleep apnea (adult) (pediatric): Secondary | ICD-10-CM

## 2021-12-29 DIAGNOSIS — Z794 Long term (current) use of insulin: Secondary | ICD-10-CM

## 2021-12-29 DIAGNOSIS — R531 Weakness: Secondary | ICD-10-CM | POA: Diagnosis not present

## 2021-12-29 DIAGNOSIS — E1122 Type 2 diabetes mellitus with diabetic chronic kidney disease: Secondary | ICD-10-CM

## 2021-12-29 DIAGNOSIS — N183 Chronic kidney disease, stage 3 unspecified: Secondary | ICD-10-CM

## 2021-12-29 LAB — LIPID PANEL
Cholesterol: 136 mg/dL (ref 0–200)
HDL: 43 mg/dL (ref >40)
LDL Cholesterol: 78 mg/dL (ref 0–99)
Total CHOL/HDL Ratio: 3.2 RATIO
Triglycerides: 74 mg/dL (ref <150)
VLDL: 15 mg/dL (ref 0–40)

## 2021-12-29 LAB — ECHOCARDIOGRAM COMPLETE
Area-P 1/2: 2.36 cm2
Height: 63 in
S' Lateral: 2.86 cm
Weight: 2880 oz

## 2021-12-29 LAB — C-REACTIVE PROTEIN: CRP: 0.6 mg/dL (ref <1.0)

## 2021-12-29 LAB — GLUCOSE, CAPILLARY
Glucose-Capillary: 102 mg/dL — ABNORMAL HIGH (ref 70–99)
Glucose-Capillary: 124 mg/dL — ABNORMAL HIGH (ref 70–99)

## 2021-12-29 LAB — HEMOGLOBIN A1C
Hgb A1c MFr Bld: 8 % — ABNORMAL HIGH (ref 4.8–5.6)
Mean Plasma Glucose: 182.9 mg/dL

## 2021-12-29 LAB — VITAMIN B12: Vitamin B-12: 186 pg/mL (ref 180–914)

## 2021-12-29 LAB — FOLATE: Folate: 19.7 ng/mL (ref >5.9)

## 2021-12-29 MED ORDER — LISINOPRIL 20 MG PO TABS
20.0000 mg | ORAL_TABLET | Freq: Every day | ORAL | Status: DC
  Administered 2021-12-29: 20 mg via ORAL
  Filled 2021-12-29: qty 1

## 2021-12-29 MED ORDER — BUPROPION HCL ER (XL) 150 MG PO TB24
300.0000 mg | ORAL_TABLET | Freq: Every day | ORAL | Status: DC
  Administered 2021-12-29: 300 mg via ORAL
  Filled 2021-12-29: qty 2

## 2021-12-29 MED ORDER — INSULIN DETEMIR 100 UNIT/ML ~~LOC~~ SOLN
30.0000 [IU] | Freq: Two times a day (BID) | SUBCUTANEOUS | Status: DC
  Administered 2021-12-29: 30 [IU] via SUBCUTANEOUS
  Filled 2021-12-29 (×4): qty 0.3

## 2021-12-29 MED ORDER — ATORVASTATIN CALCIUM 20 MG PO TABS
80.0000 mg | ORAL_TABLET | Freq: Every morning | ORAL | Status: DC
  Administered 2021-12-29: 80 mg via ORAL
  Filled 2021-12-29: qty 4

## 2021-12-29 MED ORDER — PANTOPRAZOLE SODIUM 40 MG PO TBEC
40.0000 mg | DELAYED_RELEASE_TABLET | Freq: Every day | ORAL | Status: DC
  Administered 2021-12-29: 40 mg via ORAL
  Filled 2021-12-29: qty 1

## 2021-12-29 NOTE — Progress Notes (Signed)
SLP Cancellation Note ? ?Patient Details ?Name: Kimberly Duarte ?MRN: [id number] ?DOB: [birth year] ? ? ?Cancelled treatment:       Reason Eval/Treat Not Completed: SLP screened, no needs identified, will sign off ? ?[name] B. Rutherford Nail, M.S., CCC-SLP, CBIS ?Speech-Language Pathologist ?Rehabilitation Services ?Office [phone] ? ?[name] ?12/29/2021, 10:11 AM ?

## 2021-12-29 NOTE — Progress Notes (Signed)
*  PRELIMINARY RESULTS* ?Echocardiogram ?2D Echocardiogram has been performed. ? ?Kimberly Duarte ?12/29/2021, 9:52 AM ?

## 2021-12-29 NOTE — Progress Notes (Signed)
Patient is being discharged home.  Discharge papers given and explained to patient. She verbalized understanding. Meds and f/u appointments reviewed. Patient stated that she will schedule f/u appointment with her neurologist.  ?

## 2021-12-29 NOTE — Progress Notes (Signed)
OT Cancellation Note ? ?Patient Details ?Name: Kimberly Duarte ?MRN: [id number] ?DOB: 02-Mar-1941 ? ? ?Cancelled Treatment:    Reason Eval/Treat Not Completed: OT screened, no needs identified, will sign off;Other (comment) (per PT report pt with no  vision, LUE, ADL changes; Pt appears to be performing at baseline. OT will complete order at this time. Please reconsult if there is a change in functional status.) ? ?Shanon Payor, OTD OTR/L  ?12/29/21, 11:30 AM  ?

## 2021-12-29 NOTE — Evaluation (Signed)
Physical Therapy Evaluation ?Patient Details ?Name: Kimberly Duarte ?MRN: [id number] ?DOB: 1941/06/25 ?Today's Date: 12/29/2021 ? ?History of Present Illness ? Pt is a 81 y/o F who presented to ED with c/o L sided numbness. MRI was negative for acute abnormalities. PMH: recurrent CVAs, peripheral neuropathy with gait imbalance, CKD3, OSA not on CPAP, depression, restless leg syndrome, DM2, HTN, HLD, uterine CA  ?Clinical Impression ? Pt seen for PT evaluation with pt received in bed, demonstrating good awareness of situation. Pt reports she's independent without AD, driving, no falls prior to admission. On this date, pt is able to transfer to sitting EOB with mod I with HOB elevated & bed rails, STS with mod I & toilet without assistance nor AD. Pt ambulates 1 lap around nurses station without AD with 1 LOB to L but pt able to self correct without assistance & pt continues to ambulate progressing to mod I. Pt negotiates 6 steps with 1 rail with mod I. Pt reports she's at baseline level of function with no glaring mobility deficits. No acute PT needs identified at this time & PT to sign off, pt aware & in agreement & encouraged to ambulate around unit while in acute setting.  ? ?Recommendations for follow up therapy are one component of a multi-disciplinary discharge planning process, led by the attending physician.  Recommendations may be updated based on patient status, additional functional criteria and insurance authorization. ? ?Follow Up Recommendations No PT follow up ? ?  ?Assistance Recommended at Discharge PRN  ?Patient can return home with the following ?   ? ?  ?Equipment Recommendations None recommended by PT  ?Recommendations for Other Services ?    ?  ?Functional Status Assessment Patient has not had a recent decline in their functional status  ? ?  ?Precautions / Restrictions Precautions ?Precautions: None ?Restrictions ?Weight Bearing Restrictions: No  ? ?  ? ?Mobility ? Bed Mobility ?Overal bed  mobility: Modified Independent ?  ?  ?  ?  ?  ?  ?General bed mobility comments: supine>sit with HOB slightly elevated, bed rails ?  ? ?Transfers ?Overall transfer level: Modified independent ?Equipment used: None ?  ?  ?  ?  ?  ?  ?  ?General transfer comment: STS without AD ?  ? ?Ambulation/Gait ?Ambulation/Gait assistance: Supervision, Modified independent (Device/Increase time) ?Gait Distance (Feet): 175 Feet ?Assistive device: None ?Gait Pattern/deviations: Decreased stride length ?Gait velocity: slightly decreased ?  ?  ?General Gait Details: 1 slight LOB to L but pt able to self correct without assistance, supervision progress go mod I without AD ? ?Stairs ?Stairs: Yes ?Stairs assistance: Modified independent (Device/Increase time) ?Stair Management:  (1 rail R to ascend, 1 rail R to descend) ?Number of Stairs: 6 ?General stair comments: step over step to ascend, step to dto descend ? ?Wheelchair Mobility ?  ? ?Modified Rankin (Stroke Patients Only) ?  ? ?  ? ?Balance   ?  ?Sitting balance-Leahy Scale: Normal ?  ?  ?Standing balance support: During functional activity, No upper extremity supported ?Standing balance-Leahy Scale: Good ?  ?  ?  ?  ?  ?  ?  ?  ?  ?  ?  ?  ?   ? ? ? ?Pertinent Vitals/Pain Pain Assessment ?Pain Assessment: No/denies pain  ? ? ?Home Living Family/patient expects to be discharged to:: Private residence ?Living Arrangements: Alone ?Available Help at Discharge: Family;Available PRN/intermittently ?Type of Home: House ?Home Access: Stairs to enter ?  ?Entrance  Stairs-Number of Steps: 1 step onto porch, 2 steps with B rails down into den ?  ?Home Layout: One level ?Home Equipment: Advice worker (2 wheels) ?   ?  ?Prior Function Prior Level of Function : Independent/Modified Independent ?  ?  ?  ?  ?  ?  ?Mobility Comments: Pt reports independence without AD, driving, no falls, doesn't use AD ?  ?  ? ? ?Hand Dominance  ? Dominant Hand: Right ? ?  ?Extremity/Trunk Assessment   ? Upper Extremity Assessment ?Upper Extremity Assessment: Overall WFL for tasks assessed (fingertip to thumb with all digits accurate on BUE) ?  ? ?Lower Extremity Assessment ?Lower Extremity Assessment: Overall WFL for tasks assessed ?  ? ?Cervical / Trunk Assessment ?Cervical / Trunk Assessment:  (pt endorses only numbness in upper L side of face)  ?Communication  ? Communication: No difficulties  ?Cognition Arousal/Alertness: Awake/alert ?Behavior During Therapy: [facility] for tasks assessed/performed ?Overall Cognitive Status: Within Functional Limits for tasks assessed ?  ?  ?  ?  ?  ?  ?  ?  ?  ?  ?  ?  ?  ?  ?  ?  ?  ?  ?  ? ?  ?General Comments General comments (skin integrity, edema, etc.): Pt performs toileting & hand hygiene without assistance/with Mod I. ? ?  ?Exercises    ? ?Assessment/Plan  ?  ?PT Assessment Patient does not need any further PT services  ?PT Problem List   ? ?   ?  ?PT Treatment Interventions     ? ?PT Goals (Current goals can be found in the Care Plan section)  ?Acute Rehab PT Goals ?Patient Stated Goal: go home ?PT Goal Formulation: With patient/family ?Time For Goal Achievement: 01/12/22 ?Potential to Achieve Goals: Good ? ?  ?Frequency   ?  ? ? ?Co-evaluation   ?  ?  ?  ?  ? ? ?  ?AM-PAC PT "6 Clicks" Mobility  ?Outcome Measure Help needed turning from your back to your side while in a flat bed without using bedrails?: None ?Help needed moving from lying on your back to sitting on the side of a flat bed without using bedrails?: None ?Help needed moving to and from a bed to a chair (including a wheelchair)?: None ?Help needed standing up from a chair using your arms (e.g., wheelchair or bedside chair)?: None ?Help needed to walk in hospital room?: None ?Help needed climbing 3-5 steps with a railing? : None ?6 Click Score: 24 ? ?  ?End of Session   ?Activity Tolerance: Patient tolerated treatment well ?Patient left: in chair;with call bell/phone within reach;with family/visitor  present ?Nurse Communication: Mobility status ?  ?  ? ?Time: 4801-6553 ?PT Time Calculation (min) (ACUTE ONLY): 17 min ? ? ?Charges:   PT Evaluation ?$PT Eval Low Complexity: 1 Low ?  ?  ?   ? ? ?Lavone Nian, PT, DPT ?12/29/21, 11:20 AM ? ? ?Waunita Schooner ?12/29/2021, 11:17 AM ? ?

## 2021-12-29 NOTE — Discharge Summary (Signed)
Physician Discharge Summary  ?MCKINZEE SPIRITO JAS:[id number] DOB: [birth year] DOA: 12/28/2021 ? ?PCP: Tracie Harrier, MD ? ?Admit date: 12/28/2021 ?Discharge date: 12/29/2021 ? ?Admitted From: Observation ?Disposition: home ? ?Recommendations for Outpatient Follow-up:  ?Follow up with PCP in 1-2 weeks ?Please obtain BMP/CBC in one week ?Please follow up on the following pending results: ? ?Home Health:No ?Equipment/Devices:no new equipemnt  ?Discharge Condition:Stable ?CODE STATUS:Full code ?Diet recommendation:  resume pre-admission diet ? ?Brief/Interim Summary: ?Per admitting MD: ?LOUANNA VANLIEW is a 81 y.o. 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 Patient notes symptoms ongoing all day so she decided to present to ED. Patient noted no confusion, speech difficulties,difficulty swallowing, vision changes, or muscle weakness. Patient states symptoms started with heavy feeling in her head that she describes as a headache. She then noted her left side started to feel numb. She noted no associated weakness but felt off balanced. Patient states that currently she is still experiencing numbness but its less so than at onset of symptoms. She also notes her HA has resolved. She notes no recent URI symptoms, no sob, cough , chest pain , falls/ trauma,n/v/d/ abdominal pain or poor appetite. ? ?HOSPITAL COURSE: ?CT, CTA H/N AND MRI WERE OBTAINED AND WERE NEGATIVE FOR ACUTE PROCESS.  LABS WERE REVIEWED WHICH SHOWED GOOD CHOLESTEROL CONTROL.  DISCUSSED WITH PATIENT BEING SEEN BY NEUROLOGY IN CONSULTATION, BUT SHE ELECTED TO BE DISCHARGED HOME, INDICATING SHE WOULD RATHER F/UP  WITH HER OUTPT NEUROLOGY TEAM GIVEN THAT THE WORKUP HAS BEEN NEGATIVE ? ?Discharge Diagnoses:  ?Principal Problem: ?  TIA (transient ischemic attack) ? ? ? ?Discharge Instructions ? ?Discharge Instructions   ? ? Call MD for:   Complete by: As directed ?  ? For any acute change in medical condition  ? Diet - low sodium heart healthy   Complete by: As directed ?  ? Diet Carb Modified   Complete by: As directed ?  ? Increase activity slowly   Complete by: As directed ?  ? ?  ? ?Allergies as of 12/29/2021   ? ?   Reactions  ? Actos [pioglitazone] Other (See Comments)  ? Was unable to tolerate - caused stomach cramps ?Reaction:  Stomach cramps   ? Byetta 10 Mcg Pen [exenatide] Nausea And Vomiting, Other (See Comments)  ? Was unable to tolerate - made her sick  ? Victoza [liraglutide] Other (See Comments)  ? caused her feet to burn and sting and stopped it ?Reaction:  Burning and stinging of feet   ? ?  ? ?  ?Medication List  ?  ? ?TAKE these medications   ? ?aspirin EC 81 MG tablet ?Take 81 mg by mouth daily. Swallow whole. ?What changed: Another medication with the same name was removed. Continue taking this medication, and follow the directions you see here. ?  ?atorvastatin 80 MG tablet ?Commonly known as: LIPITOR ?Take 1 tablet (80 mg total) by mouth at bedtime. ?  ?buPROPion 300 MG 24 hr tablet ?Commonly known as: WELLBUTRIN XL ?Take 300 mg by mouth daily. ?  ?citalopram 20 MG tablet ?Commonly known as: CELEXA ?Take 20 mg by mouth daily. ?  ?clopidogrel 75 MG tablet ?Commonly  known as: PLAVIX ?Take 1 tablet (75 mg total) by mouth daily. ?  ?donepezil 10 MG tablet ?Commonly known as: ARICEPT ?Take 10 mg by mouth daily. ?  ?Farxiga 10 MG Tabs tablet ?Generic drug: dapagliflozin propanediol ?Take 10 mg by mouth daily. ?  ?glipiZIDE 10 MG tablet ?Commonly known as: GLUCOTROL ?Take 10 mg by mouth 2 (two) times daily. ?  ?hydrochlorothiazide 12.5 MG capsule ?Commonly known as: MICROZIDE ?Take 12.5 mg by mouth daily. ?   ?insulin lispro 100 UNIT/ML KiwkPen ?Commonly known as: HUMALOG ?Inject 14 Units into the skin 3 (three) times daily. ?  ?insulin NPH Human 100 UNIT/ML injection ?Commonly known as: NOVOLIN N ?Inject 15-30 Units into the skin 3 (three) times daily. tk 30 units qam and 30 units qhs, and if sugar is high during the afternoon, tk an additional 15 units ?  ?lisinopril 20 MG tablet ?Commonly known as: ZESTRIL ?Take 20 mg by mouth daily. ?  ?meloxicam 15 MG tablet ?Commonly known as: MOBIC ?Take 15 mg by mouth daily. ?  ?metFORMIN 1000 MG tablet ?Commonly known as: GLUCOPHAGE ?Take 1,000 mg by mouth 2 (two) times daily with a meal. ?  ?omeprazole 20 MG capsule ?Commonly known as: PRILOSEC ?Take 20 mg by mouth daily as needed. ?  ?pregabalin 225 MG capsule ?Commonly known as: LYRICA ?Take 225 mg by mouth 2 (two) times daily. ?  ?rOPINIRole 0.5 MG tablet ?Commonly known as: REQUIP ?Take 0.5 mg by mouth every evening. ?  ? ?  ? ? ?Allergies  ?Allergen Reactions  ? Actos [Pioglitazone] Other (See Comments)  ?  Was unable to tolerate - caused stomach cramps ?Reaction:  Stomach cramps   ? Byetta 10 Mcg Pen [Exenatide] Nausea And Vomiting and Other (See Comments)  ?  Was unable to tolerate - made her sick  ? Victoza [Liraglutide] Other (See Comments)  ?  caused her feet to burn and sting and stopped it ?Reaction:  Burning and stinging of feet  ?  ? ? ?Consultations: ?DECLINED INPT NEUROLOGY ? ? ?Procedures/Studies: ?CT ANGIO HEAD NECK W WO CM ? ?Result Date: 12/28/2021 ?CLINICAL DATA:  Initial evaluation for acute stroke. EXAM: CT ANGIOGRAPHY HEAD AND NECK TECHNIQUE: Multidetector CT imaging of the head and neck was performed using the standard protocol during bolus administration of intravenous contrast. Multiplanar CT image reconstructions and MIPs were obtained to evaluate the vascular anatomy. Carotid stenosis measurements (when applicable) are obtained utilizing NASCET criteria, using the distal internal carotid diameter as  the denominator. RADIATION DOSE REDUCTION: This exam was performed according to the departmental dose-optimization program which includes automated exposure control, adjustment of the mA and/or kV according to patient size and/or use of iterative reconstruction technique. CONTRAST:  51m OMNIPAQUE IOHEXOL 350 MG/ML SOLN COMPARISON:  CT from earlier same day and CTA from 05/16/2017. FINDINGS: CTA NECK FINDINGS Aortic arch: Visualized aortic arch normal caliber with normal branch pattern. Mild-to-moderate atheromatous plaque about the arch and origin of the great vessels without significant stenosis. Right carotid system: Right common and internal carotid arteries mildly tortuous without dissection or acute abnormality. Mild plaque about the right carotid bulb/proximal right ICA without significant stenosis. Left carotid system: Left common and internal carotid arteries are tortuous without dissection or other acute abnormality. Mild-to-moderate plaque about the left carotid bulb/proximal left ICA without hemodynamically significant stenosis. Vertebral arteries: Both vertebral arteries arise from subclavian arteries. No proximal subclavian artery stenosis. Vertebral arteries tortuous without dissection or stenosis. Skeleton: Subcentimeter lucent lesion within the T1  vertebral body is stable from prior, consistent with a benign finding. No worrisome osseous lesions. Mild-to-moderate spondylosis at C5-6 and C6-7. Patient is edentulous. Other neck: No other acute soft tissue abnormality within the neck. Upper chest: Few scattered foci of soft tissue emphysema within the anterior upper chest, likely related IV access. Visualized upper chest demonstrates no other acute finding. Review of the MIP images confirms the above findings CTA HEAD FINDINGS Anterior circulation: Petrous segments patent bilaterally. Atheromatous plaque within the carotid siphons with no more than mild multifocal narrowing. A1 segments patent  bilaterally. Normal anterior communicating artery complex. Anterior cerebral arteries patent to their distal aspects without stenosis. No M1 stenosis or occlusion. Normal MCA bifurcations. Distal MCA branches perf

## 2022-05-12 ENCOUNTER — Encounter: Payer: Self-pay | Admitting: Emergency Medicine

## 2022-05-12 ENCOUNTER — Emergency Department: Payer: Medicare PPO

## 2022-05-12 ENCOUNTER — Emergency Department
Admission: EM | Admit: 2022-05-12 | Discharge: 2022-05-13 | Disposition: A | Discharge status: Home / Self Care | Payer: Medicare PPO | Attending: Emergency Medicine | Admitting: Emergency Medicine

## 2022-05-12 DIAGNOSIS — E119 Type 2 diabetes mellitus without complications: Secondary | ICD-10-CM | POA: Insufficient documentation

## 2022-05-12 DIAGNOSIS — R519 Headache, unspecified: Secondary | ICD-10-CM | POA: Diagnosis not present

## 2022-05-12 DIAGNOSIS — R202 Paresthesia of skin: Secondary | ICD-10-CM

## 2022-05-12 DIAGNOSIS — I10 Essential (primary) hypertension: Secondary | ICD-10-CM | POA: Diagnosis not present

## 2022-05-12 DIAGNOSIS — Y900 Blood alcohol level of less than 20 mg/100 ml: Secondary | ICD-10-CM | POA: Insufficient documentation

## 2022-05-12 DIAGNOSIS — R200 Anesthesia of skin: Secondary | ICD-10-CM | POA: Insufficient documentation

## 2022-05-12 LAB — DIFFERENTIAL
Abs Immature Granulocytes: 0.01 10*3/uL (ref 0.00–0.07)
Basophils Absolute: 0 10*3/uL (ref 0.0–0.1)
Basophils Relative: 0 %
Eosinophils Absolute: 0.1 10*3/uL (ref 0.0–0.5)
Eosinophils Relative: 1 %
Immature Granulocytes: 0 %
Lymphocytes Relative: 20 %
Lymphs Abs: 1.1 10*3/uL (ref 0.7–4.0)
Monocytes Absolute: 0.3 10*3/uL (ref 0.1–1.0)
Monocytes Relative: 6 %
Neutro Abs: 4 10*3/uL (ref 1.7–7.7)
Neutrophils Relative %: 73 %

## 2022-05-12 LAB — CBC
HCT: 41.9 % (ref 36.0–46.0)
Hemoglobin: 13.4 g/dL (ref 12.0–15.0)
MCH: 29.2 pg (ref 26.0–34.0)
MCHC: 32 g/dL (ref 30.0–36.0)
MCV: 91.3 fL (ref 80.0–100.0)
Platelets: 186 10*3/uL (ref 150–400)
RBC: 4.59 MIL/uL (ref 3.87–5.11)
RDW: 14 % (ref 11.5–15.5)
WBC: 5.5 10*3/uL (ref 4.0–10.5)
nRBC: 0 % (ref 0.0–0.2)

## 2022-05-12 LAB — COMPREHENSIVE METABOLIC PANEL
ALT: 16 U/L (ref 0–44)
AST: 19 U/L (ref 15–41)
Albumin: 3.8 g/dL (ref 3.5–5.0)
Alkaline Phosphatase: 86 U/L (ref 38–126)
Anion gap: 7 (ref 5–15)
BUN: 18 mg/dL (ref 8–23)
CO2: 22 mmol/L (ref 22–32)
Calcium: 9.5 mg/dL (ref 8.9–10.3)
Chloride: 113 mmol/L — ABNORMAL HIGH (ref 98–111)
Creatinine, Ser: 1.55 mg/dL — ABNORMAL HIGH (ref 0.44–1.00)
GFR, Estimated: 34 mL/min — ABNORMAL LOW (ref >60)
Glucose, Bld: 94 mg/dL (ref 70–99)
Potassium: 4 mmol/L (ref 3.5–5.1)
Sodium: 142 mmol/L (ref 135–145)
Total Bilirubin: 0.9 mg/dL (ref 0.3–1.2)
Total Protein: 6.9 g/dL (ref 6.5–8.1)

## 2022-05-12 LAB — ETHANOL: Alcohol, Ethyl (B): <10 mg/dL (ref <10)

## 2022-05-12 LAB — APTT: aPTT: 33 seconds (ref 24–36)

## 2022-05-12 LAB — PROTIME-INR
INR: 1 (ref 0.8–1.2)
Prothrombin Time: 13.4 seconds (ref 11.4–15.2)

## 2022-05-12 MED ORDER — ONDANSETRON 4 MG PO TBDP
4.0000 mg | ORAL_TABLET | Freq: Once | ORAL | Status: AC
Start: 2022-05-12 — End: 2022-05-12
  Administered 2022-05-12: 4 mg via ORAL
  Filled 2022-05-12: qty 1

## 2022-05-12 MED ORDER — ONDANSETRON HCL 4 MG/2ML IJ SOLN
4.0000 mg | Freq: Once | INTRAMUSCULAR | Status: AC
  Administered 2022-05-13: 4 mg via INTRAVENOUS
  Filled 2022-05-12: qty 2

## 2022-05-12 MED ORDER — ACETAMINOPHEN 500 MG PO TABS
1000.0000 mg | ORAL_TABLET | Freq: Once | ORAL | Status: AC
  Administered 2022-05-13: 1000 mg via ORAL
  Filled 2022-05-12: qty 2

## 2022-05-12 MED ORDER — SODIUM CHLORIDE 0.9 % IV BOLUS
1000.0000 mL | Freq: Once | INTRAVENOUS | Status: AC
  Administered 2022-05-13: 1000 mL via INTRAVENOUS

## 2022-05-12 NOTE — ED Provider Notes (Signed)
[facility] Provider Note    Event Date/Time   First MD Initiated Contact with Patient 05/12/22 2300     (approximate)  History   Chief Complaint: Numbness  HPI  Kimberly Duarte is a 81 y.o. female with a past medical history of depression, diabetes, gastric reflux, hypertension, hyperlipidemia, prior CVA who presents to the emergency department for worsening left facial numbness.  According to the patient since around 1 PM today she has had worsening left facial numbness.  Patient states a history of a prior CVA with some left facial deficits but states it feels worse than it normally does, more numb than it normally does.  Denies any significant weakness or numbness of any arm or leg denies any trouble speaking or thinking.  Does state a moderate headache currently.  Physical Exam   Triage Vital Signs: ED Triage Vitals  Enc Vitals Group     BP 05/12/22 2053 (!) 150/76     Pulse Rate 05/12/22 2053 66     Resp 05/12/22 2053 18     Temp 05/12/22 2053 97.9 F (36.6 C)     Temp Source 05/12/22 2053 Oral     SpO2 05/12/22 2053 100 %     Weight 05/12/22 2052 172 lb (78 kg)     Height 05/12/22 2052 '5\' 3"'$  (1.6 m)     Head Circumference --      Peak Flow --      Pain Score 05/12/22 2053 0     Pain Loc --      Pain Edu? --      Excl. in [place]? --     Most recent vital signs: Vitals:   05/12/22 2053 05/12/22 2307  BP: (!) 150/76 (!) 153/49  Pulse: 66 (!) 57  Resp: 18 11  Temp: 97.9 F (36.6 C) 97.9 F (36.6 C)  SpO2: 100% 100%    General: Awake, no distress.  CV:  Good peripheral perfusion.  Regular rate and rhythm  Resp:  Normal effort.  Equal breath sounds bilaterally.  Abd:  No distention.  Soft, nontender.  No rebound or guarding. Other:  No pronator drift.  Equal grip strength bilaterally.  5/5 motor in all extremities.  No obvious cranial nerve deficits besides subjective decreased sensation to left facial touch.   ED Results / Procedures /  Treatments   EKG  EKG viewed and interpreted by myself shows a normal sinus rhythm at 68 bpm with a narrow QRS, normal axis, normal intervals, no concerning ST changes.  RADIOLOGY  I personally reviewed and interpreted the CT head images.  I do not see any obvious bleed on my evaluation.  Radiology has read the CT scan as chronic changes without acute abnormality.   MEDICATIONS ORDERED IN ED: Medications  ondansetron (ZOFRAN-ODT) disintegrating tablet 4 mg (4 mg Oral Given 05/12/22 2212)     IMPRESSION / MDM / ASSESSMENT AND PLAN / ED COURSE  I reviewed the triage vital signs and the nursing notes.  Patient's presentation is most consistent with acute presentation with potential threat to life or bodily function.  Patient presents emergency department for left-sided facial numbness worse than typical since 1 PM today.  Continues to have worsening numbness does state a moderate headache as well has a history of a prior CVA but given the increased numbness we will proceed with further work-up.  Lab work is reassuring including a normal CBC, slight renal insufficiency on chemistry otherwise reassuring, slightly worse than  historical values we will IV hydrate.  CT scan of the head shows no acute abnormality.  We will treat the patient's headache, IV hydrate.  Given the patient's worsening symptoms we will obtain an MRI of the brain to further evaluate.  Patient agreeable to plan of care.  MRI is negative for acute abnormality.  Patient is feeling better after fluids and Tylenol.  We will discharge the patient with neurology follow-up.  Patient agreeable to plan of care.  FINAL CLINICAL IMPRESSION(S) / ED DIAGNOSES   Headache Left facial numbness    Note:  This document was prepared using Dragon voice recognition software and may include unintentional dictation errors.   Harvest Dark, MD 05/13/22 [phone]

## 2022-05-12 NOTE — ED Notes (Signed)
Escorted to CT by this RN '@2059'$ 

## 2022-05-12 NOTE — ED Triage Notes (Signed)
Pt presents via POV with complaints of left sided facial numbness that started between 1-2pm. Hx of CVA with left sided numbness as a residual deficit but feels an increased numbness than before starting earlier today. NIHSS -1  Denies CP or SOB.     CBG -95

## 2022-05-12 NOTE — ED Notes (Addendum)
Pt is A&Ox4. Visitor at beside who answers some questions for pt. Pt says she has been feeling nauseous off and on for a week. Pt was seen by a home health nurse on Monday and had EMS come out to check pt out. Pt was found to have a normal EKG. Pt has had no vomiting and one incident of diarrhea this week. Pt has HX of TIA and CVA. Pt has numbness on left side of face that she states is worse today

## 2022-05-12 NOTE — ED Notes (Signed)
Patient transported to MRI 

## 2022-12-23 ENCOUNTER — Other Ambulatory Visit: Payer: Self-pay

## 2022-12-23 ENCOUNTER — Emergency Department
Admission: EM | Admit: 2022-12-23 | Discharge: 2022-12-23 | Disposition: A | Discharge status: Home / Self Care | Payer: Medicare PPO | Attending: Emergency Medicine | Admitting: Emergency Medicine

## 2022-12-23 ENCOUNTER — Emergency Department: Payer: Medicare PPO

## 2022-12-23 DIAGNOSIS — R200 Anesthesia of skin: Secondary | ICD-10-CM | POA: Diagnosis present

## 2022-12-23 DIAGNOSIS — E1122 Type 2 diabetes mellitus with diabetic chronic kidney disease: Secondary | ICD-10-CM | POA: Diagnosis not present

## 2022-12-23 DIAGNOSIS — I129 Hypertensive chronic kidney disease with stage 1 through stage 4 chronic kidney disease, or unspecified chronic kidney disease: Secondary | ICD-10-CM | POA: Insufficient documentation

## 2022-12-23 DIAGNOSIS — R202 Paresthesia of skin: Secondary | ICD-10-CM | POA: Diagnosis not present

## 2022-12-23 DIAGNOSIS — R531 Weakness: Secondary | ICD-10-CM | POA: Insufficient documentation

## 2022-12-23 DIAGNOSIS — Z1152 Encounter for screening for COVID-19: Secondary | ICD-10-CM | POA: Diagnosis not present

## 2022-12-23 DIAGNOSIS — N189 Chronic kidney disease, unspecified: Secondary | ICD-10-CM | POA: Diagnosis not present

## 2022-12-23 DIAGNOSIS — Z8673 Personal history of transient ischemic attack (TIA), and cerebral infarction without residual deficits: Secondary | ICD-10-CM | POA: Insufficient documentation

## 2022-12-23 LAB — CBC WITH DIFFERENTIAL/PLATELET
Abs Immature Granulocytes: 0.01 10*3/uL (ref 0.00–0.07)
Basophils Absolute: 0 10*3/uL (ref 0.0–0.1)
Basophils Relative: 0 %
Eosinophils Absolute: 0.2 10*3/uL (ref 0.0–0.5)
Eosinophils Relative: 3 %
HCT: 40.6 % (ref 36.0–46.0)
Hemoglobin: 13.4 g/dL (ref 12.0–15.0)
Immature Granulocytes: 0 %
Lymphocytes Relative: 29 %
Lymphs Abs: 2.1 10*3/uL (ref 0.7–4.0)
MCH: 29.6 pg (ref 26.0–34.0)
MCHC: 33 g/dL (ref 30.0–36.0)
MCV: 89.6 fL (ref 80.0–100.0)
Monocytes Absolute: 0.6 10*3/uL (ref 0.1–1.0)
Monocytes Relative: 8 %
Neutro Abs: 4.5 10*3/uL (ref 1.7–7.7)
Neutrophils Relative %: 60 %
Platelets: 192 10*3/uL (ref 150–400)
RBC: 4.53 MIL/uL (ref 3.87–5.11)
RDW: 13.5 % (ref 11.5–15.5)
WBC: 7.4 10*3/uL (ref 4.0–10.5)
nRBC: 0 % (ref 0.0–0.2)

## 2022-12-23 LAB — COMPREHENSIVE METABOLIC PANEL
ALT: 20 U/L (ref 0–44)
AST: 19 U/L (ref 15–41)
Albumin: 3.8 g/dL (ref 3.5–5.0)
Alkaline Phosphatase: 87 U/L (ref 38–126)
Anion gap: 12 (ref 5–15)
BUN: 24 mg/dL — ABNORMAL HIGH (ref 8–23)
CO2: 23 mmol/L (ref 22–32)
Calcium: 9.6 mg/dL (ref 8.9–10.3)
Chloride: 107 mmol/L (ref 98–111)
Creatinine, Ser: 1.4 mg/dL — ABNORMAL HIGH (ref 0.44–1.00)
GFR, Estimated: 38 mL/min — ABNORMAL LOW (ref >60)
Glucose, Bld: 167 mg/dL — ABNORMAL HIGH (ref 70–99)
Potassium: 3.8 mmol/L (ref 3.5–5.1)
Sodium: 142 mmol/L (ref 135–145)
Total Bilirubin: 0.7 mg/dL (ref 0.3–1.2)
Total Protein: 7.1 g/dL (ref 6.5–8.1)

## 2022-12-23 LAB — RESP PANEL BY RT-PCR (RSV, FLU A&B, COVID)  RVPGX2
Influenza A by PCR: NEGATIVE
Influenza B by PCR: NEGATIVE
Resp Syncytial Virus by PCR: NEGATIVE
SARS Coronavirus 2 by RT PCR: NEGATIVE

## 2022-12-23 LAB — URINALYSIS, ROUTINE W REFLEX MICROSCOPIC
Bacteria, UA: NONE SEEN
Bilirubin Urine: NEGATIVE
Glucose, UA: >=500 mg/dL — AB
Hgb urine dipstick: NEGATIVE
Ketones, ur: NEGATIVE mg/dL
Nitrite: NEGATIVE
Protein, ur: NEGATIVE mg/dL
Specific Gravity, Urine: 1.024 (ref 1.005–1.030)
pH: 7 (ref 5.0–8.0)

## 2022-12-23 LAB — TROPONIN I (HIGH SENSITIVITY): Troponin I (High Sensitivity): 7 ng/L (ref <18)

## 2022-12-23 NOTE — ED Triage Notes (Signed)
Pt here with weakness and numbness since Thurs. Pt states she has not been feeling good for the past few days but woke up today with more numbness in her left neck and arm and states she has been falling more than usual. Pt states she has had 3 strokes in the past.

## 2022-12-23 NOTE — ED Provider Notes (Signed)
[facility] Provider Note    Event Date/Time   First MD Initiated Contact with Patient 12/23/22 [phone]     (approximate)   History   Weakness and Numbness   HPI  Kimberly Duarte is a 82 y.o. female with a history of recurrent CVA's, peripheral neuropathy with gait imbalance, chronic kidney disease, sleep apnea, diabetes, hypertension, hyperlipidemia, depression, and restless leg syndrome who presents with generalized weakness for the last 5 days as well as numbness in the left side of her neck that she noted when she awoke this morning.  The patient states that when she went to bed last night she did not have this numbness, but it was present when she woke up this morning between 4 and 6.  She states that the neck also feels somewhat sore.  She has no numbness in the face or in the left arm or left leg.  She also reports generalized weakness since last week described as a sensation of feeling shaky and wobbly.  She has any fever or chills, vomiting or diarrhea, chest pain, difficulty breathing, or urinary symptoms.  She does not have a headache.  I reviewed the past medical records.  The patient was most recently admitted in March 2023.  Per the hospitalist discharge summary from 4/1 she presented with left-sided numbness.  CT was negative for acute findings.  The patient was admitted although MRI did not show any acute findings.   Physical Exam   Triage Vital Signs: ED Triage Vitals  Enc Vitals Group     BP 12/23/22 0911 (!) 158/112     Pulse Rate 12/23/22 0911 71     Resp 12/23/22 0911 18     Temp 12/23/22 0911 97.8 F (36.6 C)     Temp src --      SpO2 12/23/22 0911 100 %     Weight 12/23/22 0912 171 lb 15.3 oz (78 kg)     Height 12/23/22 0912 5\' 3"  (1.6 m)     Head Circumference --      Peak Flow --      Pain Score 12/23/22 0912 0     Pain Loc --      Pain Edu? --      Excl. in [place]? --     Most recent vital signs: Vitals:   12/23/22 1200  12/23/22 1215  BP: (!) 180/73   Pulse: 65 65  Resp: 13 13  Temp:    SpO2: 93% 96%     General: Alert and oriented, no distress.  CV:  Good peripheral perfusion.  Resp:  Normal effort.  Abd:  No distention.  Other:  EOMI.  PERRLA.  No facial droop.  Cranial nerves II through XII grossly intact.  5/5 motor strength and intact sensation of bilateral upper and lower extremities.  Mild tremor/ataxia on finger-to-nose bilaterally.  No pronator drift.  Localized numbness to the left side of the leg.  No midline spinal tenderness.   ED Results / Procedures / Treatments   Labs (all labs ordered are listed, but only abnormal results are displayed) Labs Reviewed  COMPREHENSIVE METABOLIC PANEL - Abnormal; Notable for the following components:      Result Value   Glucose, Bld 167 (*)    BUN 24 (*)    Creatinine, Ser 1.40 (*)    GFR, Estimated 38 (*)    All other components within normal limits  URINALYSIS, ROUTINE W REFLEX MICROSCOPIC - Abnormal; Notable for the  following components:   Color, Urine YELLOW (*)    APPearance CLEAR (*)    Glucose, UA >=500 (*)    Leukocytes,Ua SMALL (*)    All other components within normal limits  RESP PANEL BY RT-PCR (RSV, FLU A&B, COVID)  RVPGX2  CBC WITH DIFFERENTIAL/PLATELET  TROPONIN I (HIGH SENSITIVITY)     EKG  ED ECG REPORT I, Arta Silence, the attending physician, personally viewed and interpreted this ECG.  Date: 12/23/2022 EKG Time: 0919 Rate: 64 Rhythm: normal sinus rhythm (incorrectly read by machine as junctional rhythm) QRS Axis: normal Intervals: normal ST/T Wave abnormalities: Nonspecific ST abnormalities Narrative Interpretation: no evidence of acute ischemia    RADIOLOGY  CT head: I independently viewed and interpreted the images; there is no ICH.  Radiology report indicates no acute abnormality.   PROCEDURES:  Critical Care performed: No  Procedures   MEDICATIONS ORDERED IN ED: Medications - No data to  display   IMPRESSION / MDM / [place] / ED COURSE  I reviewed the triage vital signs and the nursing notes.  82 year old female with PMH as noted above including multiple prior CVAs presents with generalized weakness and feeling shaky for the last several days, with numbness to the left side of her neck that she awoke with this morning but no other acute neurologic deficits.  Neurologic exam is unremarkable for focal findings except for the localized decreased sensation as well as bilateral mild ataxia/tremor which the patient states has been present for the last week.  Differential diagnosis includes, but is not limited to, acute CVA, radiculopathy, dehydration, electrolyte abnormality, other metabolic disturbance, UTI, COVID or other viral infection.  Will obtain CT head, lab workup, and reassess.  Patient's presentation is most consistent with acute presentation with potential threat to life or bodily function.  The patient is on the cardiac monitor to evaluate for evidence of arrhythmia and/or significant heart rate changes.  ----------------------------------------- 1:02 PM on 12/23/2022 -----------------------------------------  CT head is negative for acute findings.  Lab workup is unremarkable.  There are no signs of UTI.  There is no leukocytosis or anemia.  Respiratory panel is negative.  Troponin is negative.  Given the duration of the symptoms since yesterday there is no indication for repeat.  Electrolytes are unremarkable.  Creatinine is elevated but at baseline for the patient.  On reassessment, the patient appears more comfortable.  She no longer has any tremor or ataxia.  She states she feels comfortable and that the localized numbness has improved.  At this time there is no evidence of acute CVA, nerve impingement or other acute CNS or spinal etiology requiring further workup.  I did consider whether the patient may require an MRI or inpatient admission for further  observation given her stroke history, however given the lack of any findings to suggest acute stroke there is no occasion for admission at this time.  The patient herself feels well and would like to go home.  I counseled her on the results of the workup.  I instructed her to follow-up with her PMD.  I gave her strict return precautions and she expresses understanding.  FINAL CLINICAL IMPRESSION(S) / ED DIAGNOSES   Final diagnoses:  Paresthesia     Rx / DC Orders   ED Discharge Orders     None        Note:  This document was prepared using Dragon voice recognition software and may include unintentional dictation errors.    Arta Silence, MD  12/23/22 1305  

## 2022-12-23 NOTE — Discharge Instructions (Signed)
Plenty of fluids and take all your normal medications as prescribed.  Follow-up with your primary care provider.  Return to the ER for new, worsening, or persistent severe numbness or weakness, headache, vomiting, difficulty walking, problems with your speech or balance, vision changes, or any other new or worsening symptoms that concern you.

## 2022-12-23 NOTE — ED Notes (Signed)
Pt verbalizes understanding of discharge instructions. Opportunity for questioning and answers were provided. Pt discharged from ED to home with son.    

## 2023-06-17 DIAGNOSIS — E1122 Type 2 diabetes mellitus with diabetic chronic kidney disease: Secondary | ICD-10-CM | POA: Diagnosis not present

## 2023-06-17 DIAGNOSIS — Z794 Long term (current) use of insulin: Secondary | ICD-10-CM | POA: Diagnosis not present

## 2023-06-17 DIAGNOSIS — Z91148 Patient's other noncompliance with medication regimen for other reason: Secondary | ICD-10-CM | POA: Diagnosis not present

## 2023-06-17 DIAGNOSIS — E1142 Type 2 diabetes mellitus with diabetic polyneuropathy: Secondary | ICD-10-CM | POA: Diagnosis not present

## 2023-06-17 DIAGNOSIS — N1832 Chronic kidney disease, stage 3b: Secondary | ICD-10-CM | POA: Diagnosis not present

## 2023-06-17 DIAGNOSIS — E1159 Type 2 diabetes mellitus with other circulatory complications: Secondary | ICD-10-CM | POA: Diagnosis not present

## 2023-06-17 DIAGNOSIS — E1165 Type 2 diabetes mellitus with hyperglycemia: Secondary | ICD-10-CM | POA: Diagnosis not present

## 2023-07-10 DIAGNOSIS — N2581 Secondary hyperparathyroidism of renal origin: Secondary | ICD-10-CM | POA: Diagnosis not present

## 2023-07-10 DIAGNOSIS — N1832 Chronic kidney disease, stage 3b: Secondary | ICD-10-CM | POA: Diagnosis not present

## 2023-07-10 DIAGNOSIS — I10 Essential (primary) hypertension: Secondary | ICD-10-CM | POA: Diagnosis not present

## 2023-07-10 DIAGNOSIS — E1122 Type 2 diabetes mellitus with diabetic chronic kidney disease: Secondary | ICD-10-CM | POA: Diagnosis not present

## 2023-07-12 DIAGNOSIS — I69398 Other sequelae of cerebral infarction: Secondary | ICD-10-CM | POA: Diagnosis not present

## 2023-07-12 DIAGNOSIS — R262 Difficulty in walking, not elsewhere classified: Secondary | ICD-10-CM | POA: Diagnosis not present

## 2023-07-12 DIAGNOSIS — I10 Essential (primary) hypertension: Secondary | ICD-10-CM | POA: Diagnosis not present

## 2023-07-12 DIAGNOSIS — N1832 Chronic kidney disease, stage 3b: Secondary | ICD-10-CM | POA: Diagnosis not present

## 2023-07-12 DIAGNOSIS — I4891 Unspecified atrial fibrillation: Secondary | ICD-10-CM | POA: Diagnosis not present

## 2023-07-12 DIAGNOSIS — F32A Depression, unspecified: Secondary | ICD-10-CM | POA: Diagnosis not present

## 2023-07-12 DIAGNOSIS — E1165 Type 2 diabetes mellitus with hyperglycemia: Secondary | ICD-10-CM | POA: Diagnosis not present

## 2023-07-12 DIAGNOSIS — E1142 Type 2 diabetes mellitus with diabetic polyneuropathy: Secondary | ICD-10-CM | POA: Diagnosis not present

## 2023-07-12 DIAGNOSIS — E1122 Type 2 diabetes mellitus with diabetic chronic kidney disease: Secondary | ICD-10-CM | POA: Diagnosis not present

## 2023-07-18 DIAGNOSIS — N1832 Chronic kidney disease, stage 3b: Secondary | ICD-10-CM | POA: Diagnosis not present

## 2023-07-18 DIAGNOSIS — E1122 Type 2 diabetes mellitus with diabetic chronic kidney disease: Secondary | ICD-10-CM | POA: Diagnosis not present

## 2023-07-18 DIAGNOSIS — I10 Essential (primary) hypertension: Secondary | ICD-10-CM | POA: Diagnosis not present

## 2023-07-18 DIAGNOSIS — F32A Depression, unspecified: Secondary | ICD-10-CM | POA: Diagnosis not present

## 2023-07-18 DIAGNOSIS — R262 Difficulty in walking, not elsewhere classified: Secondary | ICD-10-CM | POA: Diagnosis not present

## 2023-07-18 DIAGNOSIS — I4891 Unspecified atrial fibrillation: Secondary | ICD-10-CM | POA: Diagnosis not present

## 2023-07-18 DIAGNOSIS — E1142 Type 2 diabetes mellitus with diabetic polyneuropathy: Secondary | ICD-10-CM | POA: Diagnosis not present

## 2023-07-18 DIAGNOSIS — E1165 Type 2 diabetes mellitus with hyperglycemia: Secondary | ICD-10-CM | POA: Diagnosis not present

## 2023-07-18 DIAGNOSIS — I69398 Other sequelae of cerebral infarction: Secondary | ICD-10-CM | POA: Diagnosis not present

## 2023-07-29 DIAGNOSIS — E1165 Type 2 diabetes mellitus with hyperglycemia: Secondary | ICD-10-CM | POA: Diagnosis not present

## 2023-07-29 DIAGNOSIS — E1159 Type 2 diabetes mellitus with other circulatory complications: Secondary | ICD-10-CM | POA: Diagnosis not present

## 2023-07-31 DIAGNOSIS — I69398 Other sequelae of cerebral infarction: Secondary | ICD-10-CM | POA: Diagnosis not present

## 2023-07-31 DIAGNOSIS — E1142 Type 2 diabetes mellitus with diabetic polyneuropathy: Secondary | ICD-10-CM | POA: Diagnosis not present

## 2023-07-31 DIAGNOSIS — N1832 Chronic kidney disease, stage 3b: Secondary | ICD-10-CM | POA: Diagnosis not present

## 2023-07-31 DIAGNOSIS — I10 Essential (primary) hypertension: Secondary | ICD-10-CM | POA: Diagnosis not present

## 2023-07-31 DIAGNOSIS — E1122 Type 2 diabetes mellitus with diabetic chronic kidney disease: Secondary | ICD-10-CM | POA: Diagnosis not present

## 2023-07-31 DIAGNOSIS — F32A Depression, unspecified: Secondary | ICD-10-CM | POA: Diagnosis not present

## 2023-07-31 DIAGNOSIS — I4891 Unspecified atrial fibrillation: Secondary | ICD-10-CM | POA: Diagnosis not present

## 2023-07-31 DIAGNOSIS — E1165 Type 2 diabetes mellitus with hyperglycemia: Secondary | ICD-10-CM | POA: Diagnosis not present

## 2023-07-31 DIAGNOSIS — R262 Difficulty in walking, not elsewhere classified: Secondary | ICD-10-CM | POA: Diagnosis not present

## 2023-08-05 DIAGNOSIS — E1142 Type 2 diabetes mellitus with diabetic polyneuropathy: Secondary | ICD-10-CM | POA: Diagnosis not present

## 2023-08-05 DIAGNOSIS — E1122 Type 2 diabetes mellitus with diabetic chronic kidney disease: Secondary | ICD-10-CM | POA: Diagnosis not present

## 2023-08-05 DIAGNOSIS — E1159 Type 2 diabetes mellitus with other circulatory complications: Secondary | ICD-10-CM | POA: Diagnosis not present

## 2023-08-05 DIAGNOSIS — Z794 Long term (current) use of insulin: Secondary | ICD-10-CM | POA: Diagnosis not present

## 2023-08-05 DIAGNOSIS — E1165 Type 2 diabetes mellitus with hyperglycemia: Secondary | ICD-10-CM | POA: Diagnosis not present

## 2023-08-05 DIAGNOSIS — N1832 Chronic kidney disease, stage 3b: Secondary | ICD-10-CM | POA: Diagnosis not present

## 2023-08-06 DIAGNOSIS — G629 Polyneuropathy, unspecified: Secondary | ICD-10-CM | POA: Diagnosis not present

## 2023-08-06 DIAGNOSIS — Z8673 Personal history of transient ischemic attack (TIA), and cerebral infarction without residual deficits: Secondary | ICD-10-CM | POA: Diagnosis not present

## 2023-08-06 DIAGNOSIS — G2581 Restless legs syndrome: Secondary | ICD-10-CM | POA: Diagnosis not present

## 2023-08-06 DIAGNOSIS — R202 Paresthesia of skin: Secondary | ICD-10-CM | POA: Diagnosis not present

## 2023-08-06 DIAGNOSIS — R200 Anesthesia of skin: Secondary | ICD-10-CM | POA: Diagnosis not present

## 2023-08-08 DIAGNOSIS — I4891 Unspecified atrial fibrillation: Secondary | ICD-10-CM | POA: Diagnosis not present

## 2023-08-08 DIAGNOSIS — I10 Essential (primary) hypertension: Secondary | ICD-10-CM | POA: Diagnosis not present

## 2023-08-08 DIAGNOSIS — N1832 Chronic kidney disease, stage 3b: Secondary | ICD-10-CM | POA: Diagnosis not present

## 2023-08-08 DIAGNOSIS — E1122 Type 2 diabetes mellitus with diabetic chronic kidney disease: Secondary | ICD-10-CM | POA: Diagnosis not present

## 2023-08-08 DIAGNOSIS — R262 Difficulty in walking, not elsewhere classified: Secondary | ICD-10-CM | POA: Diagnosis not present

## 2023-08-08 DIAGNOSIS — I69398 Other sequelae of cerebral infarction: Secondary | ICD-10-CM | POA: Diagnosis not present

## 2023-08-08 DIAGNOSIS — E1142 Type 2 diabetes mellitus with diabetic polyneuropathy: Secondary | ICD-10-CM | POA: Diagnosis not present

## 2023-08-08 DIAGNOSIS — F32A Depression, unspecified: Secondary | ICD-10-CM | POA: Diagnosis not present

## 2023-08-08 DIAGNOSIS — E1165 Type 2 diabetes mellitus with hyperglycemia: Secondary | ICD-10-CM | POA: Diagnosis not present
